# Patient Record
Sex: Male | Born: 1940 | Hispanic: No | Marital: Married | Smoking: Never smoker
Health system: Southern US, Community
[De-identification: ages and names within clinical notes are randomized; demographics above are authoritative.]

## PROBLEM LIST (undated history)

## (undated) ENCOUNTER — Emergency Department: Discharge status: Absent without leave | Payer: Medicare Other

## (undated) DIAGNOSIS — G309 Alzheimer's disease, unspecified: Secondary | ICD-10-CM

## (undated) DIAGNOSIS — F0280 Dementia in other diseases classified elsewhere without behavioral disturbance: Secondary | ICD-10-CM

## (undated) DIAGNOSIS — G20 Parkinson's disease: Secondary | ICD-10-CM

## (undated) HISTORY — PX: HIP SURGERY: SHX245

---

## 2015-10-02 ENCOUNTER — Inpatient Hospital Stay
Admission: EM | Admit: 2015-10-02 | Discharge: 2015-10-07 | DRG: 470 | Disposition: A | Discharge status: Skilled Nursing Facility | Payer: Medicare Other | Attending: Internal Medicine | Admitting: Internal Medicine

## 2015-10-02 ENCOUNTER — Encounter: Payer: Self-pay | Admitting: Emergency Medicine

## 2015-10-02 ENCOUNTER — Emergency Department: Payer: Medicare Other

## 2015-10-02 DIAGNOSIS — S72009A Fracture of unspecified part of neck of unspecified femur, initial encounter for closed fracture: Secondary | ICD-10-CM | POA: Diagnosis present

## 2015-10-02 DIAGNOSIS — Z79899 Other long term (current) drug therapy: Secondary | ICD-10-CM | POA: Diagnosis not present

## 2015-10-02 DIAGNOSIS — F0280 Dementia in other diseases classified elsewhere without behavioral disturbance: Secondary | ICD-10-CM | POA: Diagnosis present

## 2015-10-02 DIAGNOSIS — Z96649 Presence of unspecified artificial hip joint: Secondary | ICD-10-CM

## 2015-10-02 DIAGNOSIS — S72011A Unspecified intracapsular fracture of right femur, initial encounter for closed fracture: Secondary | ICD-10-CM | POA: Diagnosis present

## 2015-10-02 DIAGNOSIS — E871 Hypo-osmolality and hyponatremia: Secondary | ICD-10-CM | POA: Diagnosis present

## 2015-10-02 DIAGNOSIS — D62 Acute posthemorrhagic anemia: Secondary | ICD-10-CM | POA: Diagnosis not present

## 2015-10-02 DIAGNOSIS — G20 Parkinson's disease: Secondary | ICD-10-CM | POA: Diagnosis present

## 2015-10-02 DIAGNOSIS — G309 Alzheimer's disease, unspecified: Secondary | ICD-10-CM | POA: Diagnosis present

## 2015-10-02 DIAGNOSIS — W010XXA Fall on same level from slipping, tripping and stumbling without subsequent striking against object, initial encounter: Secondary | ICD-10-CM | POA: Diagnosis present

## 2015-10-02 DIAGNOSIS — M6281 Muscle weakness (generalized): Secondary | ICD-10-CM

## 2015-10-02 DIAGNOSIS — Z23 Encounter for immunization: Secondary | ICD-10-CM | POA: Diagnosis not present

## 2015-10-02 DIAGNOSIS — M25551 Pain in right hip: Secondary | ICD-10-CM | POA: Diagnosis present

## 2015-10-02 DIAGNOSIS — S72001A Fracture of unspecified part of neck of right femur, initial encounter for closed fracture: Secondary | ICD-10-CM

## 2015-10-02 DIAGNOSIS — R262 Difficulty in walking, not elsewhere classified: Secondary | ICD-10-CM

## 2015-10-02 HISTORY — DX: Parkinson's disease: G20

## 2015-10-02 HISTORY — DX: Alzheimer's disease, unspecified: G30.9

## 2015-10-02 HISTORY — DX: Dementia in other diseases classified elsewhere, unspecified severity, without behavioral disturbance, psychotic disturbance, mood disturbance, and anxiety: F02.80

## 2015-10-02 LAB — CBC WITH DIFFERENTIAL/PLATELET
BASOS PCT: 1 %
Basophils Absolute: 0.1 10*3/uL (ref 0–0.1)
Eosinophils Absolute: 0.1 10*3/uL (ref 0–0.7)
Eosinophils Relative: 1 %
HEMATOCRIT: 34.6 % — AB (ref 40.0–52.0)
HEMOGLOBIN: 12 g/dL — AB (ref 13.0–18.0)
LYMPHS ABS: 0.7 10*3/uL — AB (ref 1.0–3.6)
LYMPHS PCT: 7 %
MCH: 30.4 pg (ref 26.0–34.0)
MCHC: 34.8 g/dL (ref 32.0–36.0)
MCV: 87.3 fL (ref 80.0–100.0)
MONO ABS: 1.2 10*3/uL — AB (ref 0.2–1.0)
MONOS PCT: 12 %
NEUTROS ABS: 7.6 10*3/uL — AB (ref 1.4–6.5)
NEUTROS PCT: 79 %
Platelets: 210 10*3/uL (ref 150–440)
RBC: 3.96 MIL/uL — ABNORMAL LOW (ref 4.40–5.90)
RDW: 15 % — ABNORMAL HIGH (ref 11.5–14.5)
WBC: 9.5 10*3/uL (ref 3.8–10.6)

## 2015-10-02 LAB — BASIC METABOLIC PANEL
ANION GAP: 5 (ref 5–15)
BUN: 47 mg/dL — ABNORMAL HIGH (ref 6–20)
CHLORIDE: 107 mmol/L (ref 101–111)
CO2: 27 mmol/L (ref 22–32)
Calcium: 9.1 mg/dL (ref 8.9–10.3)
Creatinine, Ser: 0.97 mg/dL (ref 0.61–1.24)
GFR calc non Af Amer: >60 mL/min (ref >60)
GLUCOSE: 145 mg/dL — AB (ref 65–99)
POTASSIUM: 3.7 mmol/L (ref 3.5–5.1)
Sodium: 139 mmol/L (ref 135–145)

## 2015-10-02 LAB — URINALYSIS COMPLETE WITH MICROSCOPIC (ARMC ONLY)
BACTERIA UA: NONE SEEN
Bilirubin Urine: NEGATIVE
Glucose, UA: 50 mg/dL — AB
LEUKOCYTES UA: NEGATIVE
Nitrite: NEGATIVE
PH: 5 (ref 5.0–8.0)
PROTEIN: 30 mg/dL — AB
Specific Gravity, Urine: 1.036 — ABNORMAL HIGH (ref 1.005–1.030)

## 2015-10-02 LAB — MAGNESIUM: Magnesium: 2.3 mg/dL (ref 1.7–2.4)

## 2015-10-02 LAB — PHOSPHORUS: PHOSPHORUS: 2.8 mg/dL (ref 2.5–4.6)

## 2015-10-02 MED ORDER — ONDANSETRON HCL 4 MG PO TABS: 4.0000 mg | ORAL_TABLET | Freq: Four times a day (QID) | ORAL | Status: DC | PRN

## 2015-10-02 MED ORDER — MORPHINE SULFATE (PF) 2 MG/ML IV SOLN: 1.0000 mg | INTRAVENOUS | Status: DC | PRN

## 2015-10-02 MED ORDER — SENNOSIDES-DOCUSATE SODIUM 8.6-50 MG PO TABS: 1.0000 | ORAL_TABLET | Freq: Every evening | ORAL | Status: DC | PRN

## 2015-10-02 MED ORDER — ZOLPIDEM TARTRATE 5 MG PO TABS
5.0000 mg | ORAL_TABLET | Freq: Every evening | ORAL | Status: DC | PRN
  Administered 2015-10-02: 5 mg via ORAL
  Filled 2015-10-02: qty 1

## 2015-10-02 MED ORDER — SODIUM CHLORIDE 0.9 % IV SOLN
INTRAVENOUS | Status: DC
  Administered 2015-10-02 – 2015-10-03 (×2): via INTRAVENOUS

## 2015-10-02 MED ORDER — ONDANSETRON HCL 4 MG/2ML IJ SOLN: 4.0000 mg | Freq: Four times a day (QID) | INTRAMUSCULAR | Status: DC | PRN

## 2015-10-02 MED ORDER — HYDROCODONE-ACETAMINOPHEN 5-325 MG PO TABS
1.0000 | ORAL_TABLET | ORAL | Status: DC | PRN
  Administered 2015-10-03: 1 via ORAL
  Filled 2015-10-02: qty 1

## 2015-10-02 MED ORDER — DONEPEZIL HCL 5 MG PO TABS
10.0000 mg | ORAL_TABLET | Freq: Every day | ORAL | Status: DC
  Administered 2015-10-04 – 2015-10-06 (×3): 10 mg via ORAL
  Filled 2015-10-02 (×2): qty 2

## 2015-10-02 MED ORDER — ENOXAPARIN SODIUM 40 MG/0.4ML ~~LOC~~ SOLN
40.0000 mg | SUBCUTANEOUS | Status: DC
Start: 2015-10-02 — End: 2015-10-04

## 2015-10-02 MED ORDER — CEFAZOLIN SODIUM-DEXTROSE 2-4 GM/100ML-% IV SOLN: 2.0000 g | INTRAVENOUS | Status: DC

## 2015-10-02 MED ORDER — CARBIDOPA-LEVODOPA 25-250 MG PO TABS
1.0000 | ORAL_TABLET | Freq: Three times a day (TID) | ORAL | Status: DC
  Administered 2015-10-02 – 2015-10-03 (×3): 1 via ORAL
  Filled 2015-10-02 (×3): qty 1

## 2015-10-02 MED ORDER — MAGNESIUM CITRATE PO SOLN
1.0000 | Freq: Once | ORAL | Status: DC | PRN
Start: 2015-10-02 — End: 2015-10-04

## 2015-10-02 MED ORDER — ACETAMINOPHEN 650 MG RE SUPP: 650.0000 mg | Freq: Four times a day (QID) | RECTAL | Status: DC | PRN

## 2015-10-02 MED ORDER — ACETAMINOPHEN 325 MG PO TABS: 650.0000 mg | ORAL_TABLET | Freq: Four times a day (QID) | ORAL | Status: DC | PRN

## 2015-10-02 MED ORDER — BISACODYL 5 MG PO TBEC: 5.0000 mg | DELAYED_RELEASE_TABLET | Freq: Every day | ORAL | Status: DC | PRN

## 2015-10-02 NOTE — ED Notes (Signed)
See triage note  Per family he took a fall 2 days ago ..haivng increased pain to right hip when standing

## 2015-10-02 NOTE — ED Provider Notes (Signed)
[facility] Emergency Department Provider Note  ____________________________________________  Time seen: Approximately 7:18 PM  I have reviewed the triage vital signs and the nursing notes.   HISTORY  Chief Complaint Fall    HPI Derek Hendricks is a 75 y.o. male with a history of Alzheimer's and Parkinson's disease, who presents emergency department with family members for complaint of right hip pain.Per the Family, patient tripped, falling hitting his right hip on a tabletop fan 2 days prior. Patient was complaining of right upper leg pain status post injury. Patient has had trouble ambulating since the event. Patient continues to complain of pain. With symptoms not improving, family presents to the emergency Department with patient for evaluation.  Per the family, the patient did not hit his head or lose consciousness at any time. Patient has not complained of any other complaints other than right hip pain. No numbness or tingling. No back pain. No chest pain, shortness of breath, abdominal pain, nausea or vomiting. Patient did have a history of injury to left hip approximately 8-10 years ago. 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   Past Medical History:  Diagnosis Date  . Alzheimer disease   . Parkinson disease (HCC)     There are no active problems to display for this patient.   Past Surgical History:  Procedure Laterality Date  . HIP SURGERY      Prior to Admission medications   Medication Sig Start Date End Date Taking? Authorizing Provider  carbidopa-levodopa (SINEMET IR) 25-250 MG tablet Take 1 tablet by mouth 3 (three) times daily.   Yes Historical Provider, MD  donepezil (ARICEPT) 10 MG tablet Take 10 mg by mouth at bedtime.   Yes Historical Provider, MD    Allergies Review of patient's allergies indicates no known allergies.  History reviewed. No pertinent family history.  Social History Social History   Substance Use Topics  . Smoking status: Never Smoker  . Smokeless tobacco: Never Used  . Alcohol use No     Review of Systems  Constitutional: No fever/chills Eyes: No visual changes.  Cardiovascular: no chest pain. Respiratory: no cough. No SOB. Gastrointestinal: No abdominal pain.  No nausea, no vomiting.   Musculoskeletal: Positive for right hip pain Skin: Negative for rash, abrasions, lacerations, ecchymosis. Neurological: Negative for headaches, focal weakness or numbness. 10-point ROS otherwise negative.  ____________________________________________   PHYSICAL EXAM:  VITAL SIGNS: ED Triage Vitals  Enc Vitals Group     BP 10/02/15 1853 (!) 134/54     Pulse Rate 10/02/15 1850 62     Resp 10/02/15 1850 16     Temp 10/02/15 1850 97.5 F (36.4 C)     Temp Source 10/02/15 1850 Oral     SpO2 10/02/15 1850 100 %     Weight 10/02/15 1852 130 lb (59 kg)     Height 10/02/15 1852 5\' 6"  (1.676 m)     Head Circumference --      Peak Flow --      Pain Score --      Pain Loc --      Pain Edu? --      Excl. in GC? --      Constitutional: Alert and oriented. Well appearing and in no acute distress. Eyes: Conjunctivae are normal. PERRL. EOMI. Head: Atraumatic.  Neck: No stridor.  No cervical spine tenderness to palpation  Cardiovascular: Normal rate, regular rhythm. Normal S1 and S2.  Good peripheral circulation. Respiratory:  Normal respiratory effort without tachypnea or retractions. Lungs CTAB. Good air entry to the bases with no decreased or absent breath sounds. Gastrointestinal: Bowel sounds 4 quadrants. Soft and nontender to palpation. No guarding or rigidity. No palpable masses. No distention.  Musculoskeletal: No gross deformity noted to pelvis or right hip upon inspection. Limited range of motion to right hip and right lower extremity. Patient is extremely tender to palpation both to the anterior and lateral aspects of the right hip. No palpable abnormality.  Examination of the knee and lumbar spine are unremarkable. Sensation intact and equal to the unaffected extremity. Dorsalis pedis pulse intact. Due to mechanism of injury with patient's age, passive range of motion is not undertaken until imaging is procured. Update: Imaging returns with femoral neck fracture. No range of motion is performed to femur/hip Neurologic:  Normal speech and language. No gross focal neurologic deficits are appreciated.  Skin:  Skin is warm, dry and intact. No rash noted. Psychiatric: Mood and affect are normal. Speech and behavior are normal. Patient exhibits appropriate insight and judgement.   ____________________________________________   LABS (all labs ordered are listed, but only abnormal results are displayed)  Labs Reviewed  BASIC METABOLIC PANEL - Abnormal; Notable for the following:       Result Value   Glucose, Bld 145 (*)    BUN 47 (*)    All other components within normal limits  CBC WITH DIFFERENTIAL/PLATELET - Abnormal; Notable for the following:    RBC 3.96 (*)    Hemoglobin 12.0 (*)    HCT 34.6 (*)    RDW 15.0 (*)    Neutro Abs 7.6 (*)    Lymphs Abs 0.7 (*)    Monocytes Absolute 1.2 (*)    All other components within normal limits   ____________________________________________  EKG   ____________________________________________  RADIOLOGY Festus Barren Cuthriell, personally viewed and evaluated these images (plain radiographs) as part of my medical decision making, as well as reviewing the written report by the radiologist.  Dg Chest 1 View  Result Date: 10/02/2015 CLINICAL DATA:  75 year old who fell 2 days ago and injured the right lower extremity. Painful ambulation since that time. Initial encounter. Preoperative evaluation. EXAM: CHEST 1 VIEW COMPARISON:  None. FINDINGS: Cardiac silhouette mildly enlarged for AP supine technique. Thoracic aorta atherosclerotic. Prominent bronchovascular markings diffusely and moderate central  peribronchial thickening. Lungs otherwise clear. No localized airspace consolidation. No pleural effusions. No pneumothorax. Normal pulmonary vascularity. IMPRESSION: Mild cardiomegaly. Moderate changes of bronchitis and/or asthma which may be acute or chronic. No acute cardiopulmonary disease otherwise. Electronically Signed   By: Hulan Saas M.D.   On: 10/02/2015 19:49   Dg Hip Unilat W Or Wo Pelvis 2-3 Views Right  Result Date: 10/02/2015 CLINICAL DATA:  75 year old who fell 2 days ago and injured the right lower extremity. Painful ambulation since that time. Initial encounter. EXAM: DG HIP (WITH OR WITHOUT PELVIS) 2-3V RIGHT COMPARISON:  None. FINDINGS: Acute subcapital right femoral neck fracture. Right hip joint anatomically aligned with mild axial joint space narrowing. No other fractures. Included AP pelvis demonstrates a left total hip arthroplasty with anatomic alignment. Sacroiliac joints intact. Symphysis pubis intact. Visualized lower lumbar spine intact. Osseous demineralization. IMPRESSION: Acute comminuted subcapital right femoral neck fracture. Electronically Signed   By: Hulan Saas M.D.   On: 10/02/2015 19:47   Dg Femur Min 2 Views Right  Result Date: 10/02/2015 CLINICAL DATA:  75 year old who fell 2 days ago and injured the right lower  extremity. Painful ambulation since that time. Initial encounter. EXAM: RIGHT FEMUR 2 VIEWS COMPARISON:  Right hip x-rays obtained concurrently. FINDINGS: Subcapital right femoral neck fracture as detailed on the report of the right hip images. No fractures elsewhere involving the femur. Moderate medial compartment joint space narrowing at the knee. IMPRESSION: Subcapital right femoral neck fracture as detailed on the report of the right hip images. No other fractures involving the femur. Electronically Signed   By: Hulan Saashomas  Lawrence M.D.   On: 10/02/2015 19:48    ____________________________________________    PROCEDURES  Procedure(s)  performed:    Procedures    Medications - No data to display   ____________________________________________   INITIAL IMPRESSION / ASSESSMENT AND PLAN / ED COURSE  Pertinent labs & imaging results that were available during my care of the patient were reviewed by me and considered in my medical decision making (see chart for details).  Review of the  [place]RS was performed in accordance of the [facility] prior to dispensing any controlled drugs.  Clinical Course    Patient's diagnosis is consistent with Fall resulting in right femoral neck fracture. X-rays revealed the above diagnosis. Patient suffered a mechanical fall, striking her right hip against an inanimate object. Patient was having pain for the last 2 days with severe limitation in ambulation. X-rays were obtained which revealed the above diagnosis. Patient did not hit his head or lose consciousness. No other concerning physical exam findings. Patient does have a history of Parkinson's and Alzheimer's dementia. Patient was a poor historian and majority of the information was provided by patient's family. Patient's family translated a few questions that patient did answer directly to the provider.   Orthopedic surgeon was consulted upon return of x-rays and he advised that patient should be admitted for surgery tomorrow. Hospitalist was consulted and patient will be admitted pending surgical intervention. Further evaluation    ____________________________________________  FINAL CLINICAL IMPRESSION(S) / ED DIAGNOSES  Final diagnoses:  Closed fracture of neck of right femur, initial encounter (HCC)      NEW MEDICATIONS STARTED DURING THIS VISIT:  New Prescriptions   No medications on file        This chart was dictated using voice recognition software/Dragon. Despite best efforts to proofread, errors can occur which can change the meaning. Any change was purely unintentional.    Racheal PatchesJonathan D Cuthriell, PA-C 10/02/15  2112    Willy EddyPatrick Robinson, MD 10/03/15 [phone]

## 2015-10-02 NOTE — ED Notes (Signed)
Pt taken to xray via stretcher  

## 2015-10-02 NOTE — ED Notes (Signed)
Pt returned from xray via stretcher.

## 2015-10-02 NOTE — ED Triage Notes (Signed)
Pt had mechanical fall 2 days ago. Denies hitting head. Hit right leg when fell and pain to right upper leg. Has had trouble walking since fall because of pain to leg. No swelling or deformity noted. No other injuries.

## 2015-10-03 ENCOUNTER — Inpatient Hospital Stay: Payer: Medicare Other | Admitting: Anesthesiology

## 2015-10-03 ENCOUNTER — Encounter
Admission: EM | Disposition: A | Discharge status: Skilled Nursing Facility | Payer: Self-pay | Source: Home / Self Care | Attending: Internal Medicine

## 2015-10-03 HISTORY — PX: HIP ARTHROPLASTY: SHX981

## 2015-10-03 LAB — BASIC METABOLIC PANEL
ANION GAP: 4 — AB (ref 5–15)
BUN: 38 mg/dL — AB (ref 6–20)
CALCIUM: 8.5 mg/dL — AB (ref 8.9–10.3)
CO2: 25 mmol/L (ref 22–32)
CREATININE: 0.81 mg/dL (ref 0.61–1.24)
Chloride: 110 mmol/L (ref 101–111)
GFR calc Af Amer: >60 mL/min (ref >60)
GLUCOSE: 91 mg/dL (ref 65–99)
Potassium: 3.5 mmol/L (ref 3.5–5.1)
Sodium: 139 mmol/L (ref 135–145)

## 2015-10-03 LAB — CBC
HCT: 30.8 % — ABNORMAL LOW (ref 40.0–52.0)
Hemoglobin: 10.8 g/dL — ABNORMAL LOW (ref 13.0–18.0)
MCH: 30.3 pg (ref 26.0–34.0)
MCHC: 35 g/dL (ref 32.0–36.0)
MCV: 86.7 fL (ref 80.0–100.0)
PLATELETS: 210 10*3/uL (ref 150–440)
RBC: 3.55 MIL/uL — ABNORMAL LOW (ref 4.40–5.90)
RDW: 15 % — AB (ref 11.5–14.5)
WBC: 6.6 10*3/uL (ref 3.8–10.6)

## 2015-10-03 LAB — PROTIME-INR
INR: 1.04
PROTHROMBIN TIME: 13.6 s (ref 11.4–15.2)

## 2015-10-03 LAB — TYPE AND SCREEN
ABO/RH(D): O POS
Antibody Screen: NEGATIVE

## 2015-10-03 LAB — SURGICAL PCR SCREEN
MRSA, PCR: NEGATIVE
Staphylococcus aureus: NEGATIVE

## 2015-10-03 SURGERY — HEMIARTHROPLASTY, HIP, DIRECT ANTERIOR APPROACH, FOR FRACTURE
Anesthesia: Spinal | Laterality: Right | Wound class: Clean

## 2015-10-03 MED ORDER — PROPOFOL 500 MG/50ML IV EMUL
INTRAVENOUS | Status: DC | PRN
  Administered 2015-10-03: 50 ug/kg/min via INTRAVENOUS

## 2015-10-03 MED ORDER — FENTANYL CITRATE (PF) 100 MCG/2ML IJ SOLN
INTRAMUSCULAR | Status: DC | PRN
  Administered 2015-10-03 (×4): 25 ug via INTRAVENOUS

## 2015-10-03 MED ORDER — BUPIVACAINE HCL (PF) 0.5 % IJ SOLN
INTRAMUSCULAR | Status: DC | PRN
  Administered 2015-10-03: 1.5 mL

## 2015-10-03 MED ORDER — SUCCINYLCHOLINE CHLORIDE 20 MG/ML IJ SOLN
INTRAMUSCULAR | Status: DC | PRN
  Administered 2015-10-03: 80 mg via INTRAVENOUS

## 2015-10-03 MED ORDER — LACTATED RINGERS IV SOLN
INTRAVENOUS | Status: DC | PRN
  Administered 2015-10-03 (×2): via INTRAVENOUS

## 2015-10-03 MED ORDER — INFLUENZA VAC SPLIT QUAD 0.5 ML IM SUSY
0.5000 mL | PREFILLED_SYRINGE | INTRAMUSCULAR | Status: AC
  Administered 2015-10-04: 0.5 mL via INTRAMUSCULAR
  Filled 2015-10-03: qty 0.5

## 2015-10-03 MED ORDER — EPHEDRINE SULFATE 50 MG/ML IJ SOLN
INTRAMUSCULAR | Status: DC | PRN
  Administered 2015-10-03 (×3): 5 mg via INTRAVENOUS

## 2015-10-03 MED ORDER — PROPOFOL 10 MG/ML IV BOLUS
INTRAVENOUS | Status: DC | PRN
  Administered 2015-10-03: 10 mg via INTRAVENOUS
  Administered 2015-10-03: 80 mg via INTRAVENOUS

## 2015-10-03 MED ORDER — CEFAZOLIN SODIUM-DEXTROSE 2-4 GM/100ML-% IV SOLN
2.0000 g | Freq: Once | INTRAVENOUS | Status: AC
  Administered 2015-10-03: 2 g via INTRAVENOUS
  Filled 2015-10-03: qty 100

## 2015-10-03 MED ORDER — ROCURONIUM BROMIDE 100 MG/10ML IV SOLN
INTRAVENOUS | Status: DC | PRN
  Administered 2015-10-03 (×3): 10 mg via INTRAVENOUS
  Administered 2015-10-03: 30 mg via INTRAVENOUS
  Administered 2015-10-04: 10 mg via INTRAVENOUS

## 2015-10-03 MED ORDER — PHENYLEPHRINE HCL 10 MG/ML IJ SOLN
INTRAMUSCULAR | Status: DC | PRN
  Administered 2015-10-03: 50 ug via INTRAVENOUS
  Administered 2015-10-03: 100 ug via INTRAVENOUS
  Administered 2015-10-03 (×5): 50 ug via INTRAVENOUS
  Administered 2015-10-03 – 2015-10-04 (×7): 100 ug via INTRAVENOUS
  Administered 2015-10-04: 150 ug via INTRAVENOUS
  Administered 2015-10-04 (×2): 100 ug via INTRAVENOUS

## 2015-10-03 MED ORDER — ONDANSETRON HCL 4 MG/2ML IJ SOLN
INTRAMUSCULAR | Status: DC | PRN
  Administered 2015-10-03: 4 mg via INTRAVENOUS

## 2015-10-03 MED ORDER — TETRACAINE HCL 1 % IJ SOLN
INTRAMUSCULAR | Status: DC | PRN
  Administered 2015-10-03: 10 mg via INTRASPINAL

## 2015-10-03 SURGICAL SUPPLY — 61 items
BAG DECANTER FOR FLEXI CONT (MISCELLANEOUS) ×3 IMPLANT
BLADE SAW 1 (BLADE) ×3 IMPLANT
CANISTER SUCT 1200ML W/VALVE (MISCELLANEOUS) ×3 IMPLANT
CANISTER SUCT 3000ML (MISCELLANEOUS) ×6 IMPLANT
CAPT HIP HEMI 1 ×3 IMPLANT
CATH FOL LEG HOLDER (MISCELLANEOUS) IMPLANT
CEMENT HV SMART SET (Cement) ×6 IMPLANT
DRAPE INCISE IOBAN 66X60 STRL (DRAPES) ×3 IMPLANT
DRAPE SHEET LG 3/4 BI-LAMINATE (DRAPES) ×3 IMPLANT
DRAPE TABLE BACK 80X90 (DRAPES) ×3 IMPLANT
DRSG DERMACEA 8X12 NADH (GAUZE/BANDAGES/DRESSINGS) ×3 IMPLANT
DRSG OPSITE POSTOP 4X12 (GAUZE/BANDAGES/DRESSINGS) IMPLANT
DRSG OPSITE POSTOP 4X14 (GAUZE/BANDAGES/DRESSINGS) IMPLANT
DRSG TEGADERM 4X4.75 (GAUZE/BANDAGES/DRESSINGS) IMPLANT
DURAPREP 26ML APPLICATOR (WOUND CARE) ×6 IMPLANT
ELECT BLADE 6.5 EXT (BLADE) ×3 IMPLANT
ELECT CAUTERY BLADE 6.4 (BLADE) ×3 IMPLANT
ELECT REM PT RETURN 9FT ADLT (ELECTROSURGICAL) ×3
ELECTRODE REM PT RTRN 9FT ADLT (ELECTROSURGICAL) ×1 IMPLANT
GAUZE PACK 2X3YD (MISCELLANEOUS) ×3 IMPLANT
GLOVE BIO SURGEON STRL SZ8 (GLOVE) IMPLANT
GLOVE BIOGEL M STRL SZ7.5 (GLOVE) ×6 IMPLANT
GLOVE BIOGEL PI IND STRL 9 (GLOVE) IMPLANT
GLOVE BIOGEL PI INDICATOR 9 (GLOVE)
GLOVE INDICATOR 8.0 STRL GRN (GLOVE) ×6 IMPLANT
GOWN STRL REUS W/ TWL LRG LVL3 (GOWN DISPOSABLE) ×2 IMPLANT
GOWN STRL REUS W/TWL 2XL LVL3 (GOWN DISPOSABLE) IMPLANT
GOWN STRL REUS W/TWL LRG LVL3 (GOWN DISPOSABLE) ×4
HANDLE SUCTION POOLE (INSTRUMENTS) ×1 IMPLANT
HANDPIECE INTERPULSE COAX TIP (DISPOSABLE) ×2
HEMOVAC 400CC 10FR (MISCELLANEOUS) ×3 IMPLANT
HOOD PEEL AWAY FLYTE STAYCOOL (MISCELLANEOUS) ×6 IMPLANT
IV NS 100ML SINGLE PACK (IV SOLUTION) ×3 IMPLANT
KIT RM TURNOVER STRD PROC AR (KITS) ×3 IMPLANT
NDL SAFETY 18GX1.5 (NEEDLE) ×3 IMPLANT
NEEDLE FILTER BLUNT 18X 1/2SAF (NEEDLE) ×2
NEEDLE FILTER BLUNT 18X1 1/2 (NEEDLE) ×1 IMPLANT
NS IRRIG 1000ML POUR BTL (IV SOLUTION) ×3 IMPLANT
PACK HIP PROSTHESIS (MISCELLANEOUS) ×3 IMPLANT
PILLOW ABDUC SM (MISCELLANEOUS) ×3 IMPLANT
PRESSURIZER CEMENT PROX FEM SM (MISCELLANEOUS) ×3 IMPLANT
PRESSURIZER FEM CANAL M (MISCELLANEOUS) ×3 IMPLANT
SET HNDPC FAN SPRY TIP SCT (DISPOSABLE) ×1 IMPLANT
SOL .9 NS 3000ML IRR  AL (IV SOLUTION) ×2
SOL .9 NS 3000ML IRR UROMATIC (IV SOLUTION) ×1 IMPLANT
SOL PREP PVP 2OZ (MISCELLANEOUS) ×3
SOLUTION PREP PVP 2OZ (MISCELLANEOUS) ×1 IMPLANT
SPONGE DRAIN TRACH 4X4 STRL 2S (GAUZE/BANDAGES/DRESSINGS) IMPLANT
STAPLER SKIN PROX 35W (STAPLE) ×6 IMPLANT
SUCTION POOLE HANDLE (INSTRUMENTS) ×3
SUT ETHIBOND #5 BRAIDED 30INL (SUTURE) ×3 IMPLANT
SUT VIC AB 0 CT1 36 (SUTURE) ×3 IMPLANT
SUT VIC AB 1 CT1 36 (SUTURE) ×6 IMPLANT
SUT VIC AB 2-0 CT1 27 (SUTURE) ×2
SUT VIC AB 2-0 CT1 TAPERPNT 27 (SUTURE) ×1 IMPLANT
SYR 20CC LL (SYRINGE) ×3 IMPLANT
SYR TB 1ML LUER SLIP (SYRINGE) ×3 IMPLANT
TAPE TRANSPORE STRL 2 31045 (GAUZE/BANDAGES/DRESSINGS) IMPLANT
TIP COAXIAL FEMORAL CANAL (MISCELLANEOUS) ×3 IMPLANT
TIP HIGH FLOW INTERPULSE (MISCELLANEOUS) ×3 IMPLANT
TOWER CARTRIDGE SMART MIX (DISPOSABLE) ×3 IMPLANT

## 2015-10-03 NOTE — Plan of Care (Addendum)
Patient prepped for OR. Report called. NPO since 8AM. No beta blockers. CHG x2 applied. Consents X2 signed. Family re-informed of surgery time. Patient voided.

## 2015-10-03 NOTE — Progress Notes (Signed)
[facility] [place] at [facility]                                                                                                                                                                                            Patient Demographics   Derek Hendricks, is a 75 y.o. male, DOB - [birth year], ZOX:[id number]  Admit date - 10/02/2015   Admitting Physician Tonye Royalty, DO  Outpatient Primary MD for the patient is No primary care provider on file.   LOS - 1  Subjective: Patient admitted with a hip fracture. I am able to communicate with him because I share same language. He denies any cardiac history, denies any chest pain or shortness of breath or any palpitations. Currently his pain is under control   Review of Systems:   CONSTITUTIONAL: No documented fever. No fatigue, weakness. No weight gain, no weight loss.  EYES: No blurry or double vision.  ENT: No tinnitus. No postnasal drip. No redness of the oropharynx.  RESPIRATORY: No cough, no wheeze, no hemoptysis. No dyspnea.  CARDIOVASCULAR: No chest pain. No orthopnea. No palpitations. No syncope.  GASTROINTESTINAL: No nausea, no vomiting or diarrhea. No abdominal pain. No melena or hematochezia.  GENITOURINARY: No dysuria or hematuria.  ENDOCRINE: No polyuria or nocturia. No heat or cold intolerance.  HEMATOLOGY: No anemia. No bruising. No bleeding.  INTEGUMENTARY: No rashes. No lesions.  MUSCULOSKELETAL: No arthritis. No swelling. No gout.  NEUROLOGIC: No numbness, tingling, or ataxia. No seizure-type activity.  PSYCHIATRIC: No anxiety. No insomnia. No ADD.    Vitals:   Vitals:   10/02/15 2229 10/02/15 2323 10/03/15 0454 10/03/15 0745  BP: (!) 157/75 (!) 162/68 124/61 136/60  Pulse: (!) 58 (!) 56 65 (!) 58  Resp: 18 12 18 18   Temp:  98.4 F (36.9 C) 97.6 F (36.4 C) 98.9 F (37.2 C)  TempSrc:  Oral Oral Oral  SpO2: 98% 100% 98% 97%  Weight:      Height:        Wt Readings from Last 3  Encounters:  10/02/15 130 lb (59 kg)     Intake/Output Summary (Last 24 hours) at 10/03/15 1028 Last data filed at 10/03/15 0500  Gross per 24 hour  Intake              450 ml  Output              400 ml  Net               50 ml    Physical Exam:   GENERAL: Pleasant-appearing in no apparent distress.  HEAD, EYES, EARS, NOSE AND THROAT: Atraumatic, normocephalic. Extraocular muscles are intact. Pupils equal and reactive to light. Sclerae anicteric. No conjunctival injection. No oro-pharyngeal erythema.  NECK: Supple. There is no jugular venous distention. No bruits, no lymphadenopathy, no thyromegaly.  HEART: Regular rate and rhythm,. No murmurs, no rubs, no clicks.  LUNGS: Clear to auscultation bilaterally. No rales or rhonchi. No wheezes.  ABDOMEN: Soft, flat, nontender, nondistended. Has good bowel sounds. No hepatosplenomegaly appreciated.  EXTREMITIES: No evidence of any cyanosis, clubbing, or peripheral edema.  +2 pedal and radial pulses bilaterally.  NEUROLOGIC: The patient is alert, awake, and oriented x3 with no focal motor or sensory deficits appreciated bilaterally.  SKIN: Moist and warm with no rashes appreciated.  Psych: Not anxious, depressed LN: No inguinal LN enlargement    Antibiotics   Anti-infectives    Start     Dose/Rate Route Frequency Ordered Stop   10/02/15 2200  ceFAZolin (ANCEF) IVPB 2g/100 mL premix    Comments:  SEND WITH THE PATIENT TO THE OR. DO NOT ADMINISTER ON THE UNIT!   2 g 200 mL/hr over 30 Minutes Intravenous To Surgery 10/02/15 2158 10/03/15 2200      Medications   Scheduled Meds: . carbidopa-levodopa  1 tablet Oral TID  .  ceFAZolin (ANCEF) IV  2 g Intravenous To OR  . donepezil  10 mg Oral QHS  . enoxaparin (LOVENOX) injection  40 mg Subcutaneous Q24H  . [START ON 10/04/2015] Influenza vac split quadrivalent PF  0.5 mL Intramuscular Tomorrow-1000   Continuous Infusions: . sodium chloride 75 mL/hr at 10/02/15 2336   PRN  Meds:.acetaminophen **OR** acetaminophen, bisacodyl, HYDROcodone-acetaminophen, magnesium citrate, morphine injection, ondansetron **OR** ondansetron (ZOFRAN) IV, senna-docusate, zolpidem   Data Review:   Micro Results No results found for this or any previous visit (from the past 240 hour(s)).  Radiology Reports Dg Chest 1 View  Result Date: 10/02/2015 CLINICAL DATA:  75 year old who fell 2 days ago and injured the right lower extremity. Painful ambulation since that time. Initial encounter. Preoperative evaluation. EXAM: CHEST 1 VIEW COMPARISON:  None. FINDINGS: Cardiac silhouette mildly enlarged for AP supine technique. Thoracic aorta atherosclerotic. Prominent bronchovascular markings diffusely and moderate central peribronchial thickening. Lungs otherwise clear. No localized airspace consolidation. No pleural effusions. No pneumothorax. Normal pulmonary vascularity. IMPRESSION: Mild cardiomegaly. Moderate changes of bronchitis and/or asthma which may be acute or chronic. No acute cardiopulmonary disease otherwise. Electronically Signed   By: Hulan Saashomas  Lawrence M.D.   On: 10/02/2015 19:49   Dg Hip Unilat W Or Wo Pelvis 2-3 Views Right  Result Date: 10/02/2015 CLINICAL DATA:  75 year old who fell 2 days ago and injured the right lower extremity. Painful ambulation since that time. Initial encounter. EXAM: DG HIP (WITH OR WITHOUT PELVIS) 2-3V RIGHT COMPARISON:  None. FINDINGS: Acute subcapital right femoral neck fracture. Right hip joint anatomically aligned with mild axial joint space narrowing. No other fractures. Included AP pelvis demonstrates a left total hip arthroplasty with anatomic alignment. Sacroiliac joints intact. Symphysis pubis intact. Visualized lower lumbar spine intact. Osseous demineralization. IMPRESSION: Acute comminuted subcapital right femoral neck fracture. Electronically Signed   By: Hulan Saashomas  Lawrence M.D.   On: 10/02/2015 19:47   Dg Femur Min 2 Views Right  Result Date:  10/02/2015 CLINICAL DATA:  75 year old who fell 2 days ago and injured the right lower extremity. Painful ambulation since that time. Initial encounter. EXAM: RIGHT FEMUR 2 VIEWS COMPARISON:  Right hip x-rays obtained concurrently. FINDINGS: Subcapital right femoral neck fracture as detailed  on the report of the right hip images. No fractures elsewhere involving the femur. Moderate medial compartment joint space narrowing at the knee. IMPRESSION: Subcapital right femoral neck fracture as detailed on the report of the right hip images. No other fractures involving the femur. Electronically Signed   By: Hulan Saas M.D.   On: 10/02/2015 19:48     CBC  Recent Labs Lab 10/02/15 2022 10/03/15 0515  WBC 9.5 6.6  HGB 12.0* 10.8*  HCT 34.6* 30.8*  PLT 210 210  MCV 87.3 86.7  MCH 30.4 30.3  MCHC 34.8 35.0  RDW 15.0* 15.0*  LYMPHSABS 0.7*  --   MONOABS 1.2*  --   EOSABS 0.1  --   BASOSABS 0.1  --     Chemistries   Recent Labs Lab 10/02/15 2022 10/03/15 0515  NA 139 139  K 3.7 3.5  CL 107 110  CO2 27 25  GLUCOSE 145* 91  BUN 47* 38*  CREATININE 0.97 0.81  CALCIUM 9.1 8.5*  MG 2.3  --    ------------------------------------------------------------------------------------------------------------------ estimated creatinine clearance is 64.7 mL/min (by C-G formula based on SCr of 0.81 mg/dL). ------------------------------------------------------------------------------------------------------------------ No results for input(s): HGBA1C in the last 72 hours. ------------------------------------------------------------------------------------------------------------------ No results for input(s): CHOL, HDL, LDLCALC, TRIG, CHOLHDL, LDLDIRECT in the last 72 hours. ------------------------------------------------------------------------------------------------------------------ No results for input(s): TSH, T4TOTAL, T3FREE, THYROIDAB in the last 72 hours.  Invalid input(s):  FREET3 ------------------------------------------------------------------------------------------------------------------ No results for input(s): VITAMINB12, FOLATE, FERRITIN, TIBC, IRON, RETICCTPCT in the last 72 hours.  Coagulation profile  Recent Labs Lab 10/03/15 0515  INR 1.04    No results for input(s): DDIMER in the last 72 hours.  Cardiac Enzymes No results for input(s): CKMB, TROPONINI, MYOGLOBIN in the last 168 hours.  Invalid input(s): CK ------------------------------------------------------------------------------------------------------------------ Invalid input(s): POCBNP    Assessment & Plan   This is a 75 y.o. male with a history of Alzheimer's dementia and Parkinson's now being admitted with: 1. Right femoral neck fracture- Patient currently has no cardiopulmonary symptoms, no cardiac history At this time he is at moderate risk of surgical complications, he does not need any cardiac clearance from cardiology. I will cancel the cardiology consult okay to proceed to surgery  2. Hyponatremia, mild-gentle IV normal saline 3. History of Alzheimer's dementia-continue Sinemet and Aricept.     Code Status Orders        Start     Ordered   10/02/15 2159  Full code  Continuous     10/02/15 2158    Code Status History    Date Active Date Inactive Code Status Order ID Comments User Context   This patient has a current code status but no historical code status.           Consults  Orthopedic   DVT Prophylaxis  SCDs  Lab Results  Component Value Date   PLT 210 10/03/2015     Time Spent in minutes   Greater than 50% of time spent in care coordination and counseling patient regarding the condition and plan of care.   Auburn Bilberry M.D on 10/03/2015 at 10:28 AM  Between 7am to 6pm - Pager - [phone]  After 6pm go to [email or web address] - password [email or web address] [facility]  [facility] [place] [facility]   Office  [phone]

## 2015-10-03 NOTE — Anesthesia Procedure Notes (Addendum)
Spinal  Patient location during procedure: OR Start time: 10/03/2015 9:41 PM End time: 10/03/2015 9:44 PM Staffing Anesthesiologist: Katy Fitch K Performed: anesthesiologist  Preanesthetic Checklist Completed: patient identified, site marked, surgical consent, pre-op evaluation, timeout performed, IV checked, risks and benefits discussed and monitors and equipment checked Spinal Block Patient position: sitting Prep: ChloraPrep Patient monitoring: heart rate, continuous pulse ox, blood pressure and cardiac monitor Approach: midline Location: L4-5 Injection technique: single-shot Needle Needle type: Whitacre and Introducer  Needle gauge: 24 G Needle length: 9 cm Assessment Sensory level: T12 Additional Notes Patient shaking very hard throughout procedure 2/2 parkinson's disease. Negative paresthesia. Negative blood return. Positive free-flowing CSF. Expiration date of kit checked and confirmed. Patient tolerated procedure well, without complications.

## 2015-10-03 NOTE — H&P (Signed)
[facility] - [place] @ [facility] Admission History and Physical [employer] Jun [place], D.O.  ---------------------------------------------------------------------------------------------------------------------   PATIENT NAME: Derek Hendricks Loeper MR#: [id number] DATE OF BIRTH: Oct 20, 1940 DATE OF ADMISSION: 10/02/2015 PRIMARY CARE PHYSICIAN: No primary care provider on file.  REQUESTING/REFERRING PHYSICIAN: ED   CHIEF COMPLAINT: Chief Complaint  Patient presents with  . Fall    HISTORY OF PRESENT ILLNESS: Derek Hendricks Kraner is a 75 y.o. male with a known history of Alzheimer's dementia and Parkinson's disease was in a usual state of health until 2 days ago when he sustained a mood mechanical fall. Per his family patient hit his right hip and had been complaining of pain and difficulty ambulating. Patient reportedly did not lose consciousness or experiencing any preceding symptoms such as dizziness, lightheadedness or syncopal episodes.  Otherwise there has been no change in status. Patient has been taking medication as prescribed and there has been no recent change in medication or diet.  There has been no recent illness, travel or sick contacts.    Patient denies fevers/chills, weakness, dizziness, chest pain, shortness of breath, N/V/C/D, abdominal pain, dysuria/frequency, changes in mental status.    PAST MEDICAL HISTORY: Past Medical History:  Diagnosis Date  . Alzheimer disease   . Parkinson disease (HCC)       PAST SURGICAL HISTORY: Past Surgical History:  Procedure Laterality Date  . HIP SURGERY        SOCIAL HISTORY: Social History  Substance Use Topics  . Smoking status: Never Smoker  . Smokeless tobacco: Never Used  . Alcohol use No      FAMILY HISTORY: History reviewed. No pertinent family history.   MEDICATIONS AT HOME: Prior to Admission medications   Medication Sig Start Date End Date Taking? Authorizing Provider  carbidopa-levodopa (SINEMET IR) 25-250 MG  tablet Take 1 tablet by mouth 3 (three) times daily.   Yes Historical Provider, MD  donepezil (ARICEPT) 10 MG tablet Take 10 mg by mouth at bedtime.   Yes Historical Provider, MD      DRUG ALLERGIES: No Known Allergies   REVIEW OF SYSTEMS: CONSTITUTIONAL: No fever/chills, fatigue, weakness, weight gain/loss, headache EYES: No blurry or double vision. ENT: No tinnitus, postnasal drip, redness or soreness of the oropharynx. RESPIRATORY: No cough, wheeze, hemoptysis, dyspnea. CARDIOVASCULAR: No chest pain, orthopnea, palpitations, syncope. GASTROINTESTINAL: No nausea, vomiting, constipation, diarrhea, abdominal pain, hematemesis, melena or hematochezia. GENITOURINARY: No dysuria or hematuria. ENDOCRINE: No polyuria or nocturia. No heat or cold intolerance. HEMATOLOGY: No anemia, bruising, bleeding. INTEGUMENTARY: No rashes, ulcers, lesions. MUSCULOSKELETAL: No arthritis, swelling, gout. Positive right hip pain NEUROLOGIC: No numbness, tingling, weakness or ataxia. No seizure-type activity. PSYCHIATRIC: No anxiety, depression, insomnia.  PHYSICAL EXAMINATION: VITAL SIGNS: Blood pressure (!) 162/68, pulse (!) 56, temperature 98.4 F (36.9 C), temperature source Oral, resp. rate 12, height 5\' 6"  (1.676 m), weight 59 kg (130 lb), SpO2 100 %.  GENERAL: 75 y.o.-year-old male patient, well-developed, well-nourished lying in the bed in no acute distress.  Pleasant and cooperative.   HEENT: Head atraumatic, normocephalic. Pupils equal, round, reactive to light and accommodation. No scleral icterus. Extraocular muscles intact. Nares are patent. Oropharynx is clear. Mucus membranes moist. NECK: Supple, full range of motion. No JVD, no bruit heard. No thyroid enlargement, no tenderness, no cervical lymphadenopathy. CHEST: Normal breath sounds bilaterally. No wheezing, rales, rhonchi or crackles. No use of accessory muscles of respiration.  No reproducible chest wall tenderness.  CARDIOVASCULAR: S1,  S2 normal. No murmurs, rubs, or gallops. Cap refill <2 seconds.  ABDOMEN: Soft, nontender, nondistended. No rebound, guarding, rigidity. Normoactive bowel sounds present in all four quadrants. No organomegaly or mass. EXTREMITIES: Tenderness to the right hip with limitation of range of motion in all planes secondary to pain. No pedal edema, cyanosis, or clubbing. NEUROLOGIC: Cranial nerves II through XII are grossly intact with no focal sensorimotor deficit. Muscle strength 5/5 in all extremities. Sensation intact. Gait not checked. PSYCHIATRIC: The patient is alert and oriented x 3. Normal affect, mood, thought content. SKIN: Warm, dry, and intact without obvious rash, lesion, or ulcer.  LABORATORY PANEL:  CBC  Recent Labs Lab 10/02/15 2022  WBC 9.5  HGB 12.0*  HCT 34.6*  PLT 210   ----------------------------------------------------------------------------------------------------------------- Chemistries  Recent Labs Lab 10/02/15 2022  NA 139  K 3.7  CL 107  CO2 27  GLUCOSE 145*  BUN 47*  CREATININE 0.97  CALCIUM 9.1  MG 2.3   ------------------------------------------------------------------------------------------------------------------ Cardiac Enzymes No results for input(s): TROPONINI in the last 168 hours. ------------------------------------------------------------------------------------------------------------------  RADIOLOGY: Dg Chest 1 View  Result Date: 10/02/2015 CLINICAL DATA:  75 year old who fell 2 days ago and injured the right lower extremity. Painful ambulation since that time. Initial encounter. Preoperative evaluation. EXAM: CHEST 1 VIEW COMPARISON:  None. FINDINGS: Cardiac silhouette mildly enlarged for AP supine technique. Thoracic aorta atherosclerotic. Prominent bronchovascular markings diffusely and moderate central peribronchial thickening. Lungs otherwise clear. No localized airspace consolidation. No pleural effusions. No pneumothorax. Normal  pulmonary vascularity. IMPRESSION: Mild cardiomegaly. Moderate changes of bronchitis and/or asthma which may be acute or chronic. No acute cardiopulmonary disease otherwise. Electronically Signed   By: Hulan Saas M.D.   On: 10/02/2015 19:49   Dg Hip Unilat W Or Wo Pelvis 2-3 Views Right  Result Date: 10/02/2015 CLINICAL DATA:  75 year old who fell 2 days ago and injured the right lower extremity. Painful ambulation since that time. Initial encounter. EXAM: DG HIP (WITH OR WITHOUT PELVIS) 2-3V RIGHT COMPARISON:  None. FINDINGS: Acute subcapital right femoral neck fracture. Right hip joint anatomically aligned with mild axial joint space narrowing. No other fractures. Included AP pelvis demonstrates a left total hip arthroplasty with anatomic alignment. Sacroiliac joints intact. Symphysis pubis intact. Visualized lower lumbar spine intact. Osseous demineralization. IMPRESSION: Acute comminuted subcapital right femoral neck fracture. Electronically Signed   By: Hulan Saas M.D.   On: 10/02/2015 19:47   Dg Femur Min 2 Views Right  Result Date: 10/02/2015 CLINICAL DATA:  75 year old who fell 2 days ago and injured the right lower extremity. Painful ambulation since that time. Initial encounter. EXAM: RIGHT FEMUR 2 VIEWS COMPARISON:  Right hip x-rays obtained concurrently. FINDINGS: Subcapital right femoral neck fracture as detailed on the report of the right hip images. No fractures elsewhere involving the femur. Moderate medial compartment joint space narrowing at the knee. IMPRESSION: Subcapital right femoral neck fracture as detailed on the report of the right hip images. No other fractures involving the femur. Electronically Signed   By: Hulan Saas M.D.   On: 10/02/2015 19:48    EKG: Pending  IMPRESSION AND PLAN:  This is a 75 y.o. male with a history of Alzheimer's dementia and Parkinson's now being admitted with: 1. Right femoral neck fracture-admit for surgical consultation, pain  control, bed rest, nothing by mouth after midnight. Given patient's age and unclear history of recent cardiac workup would request cardiology consultation for optimization preoperatively. Will order EKG, lipids. 2. Hyponatremia, mild-gentle IV normal saline 3. History of Alzheimer's dementia-continue Sinemet and Aricept.   Diet/Nutrition: Heart healthy Fluids:  IV normal saline DVT Px: Lovenox, SCDs and early ambulation Code Status: Full  All the records are reviewed and case discussed with ED provider. Management plans discussed with the patient and/or family who express understanding and agree with plan of care.   TOTAL TIME TAKING CARE OF THIS PATIENT: 60 minutes.   Daisy Lites D.O. on 10/03/2015 at 12:57 AM Between 7am to 6pm - Pager - [phone] After 6pm go to [email or web address] - [facility] [facility] Office [phone] CC: Primary care physician; No primary care provider on file.     Note: This dictation was prepared with Dragon dictation along with smaller phrase technology. Any transcriptional errors that result from this process are unintentional.

## 2015-10-03 NOTE — Consult Note (Signed)
ORTHOPAEDIC CONSULTATION  PATIENT NAME: Derek Hendricks DOB: 11/08/1940  MRN: [id number]  REQUESTING PHYSICIAN: Tonye RoyaltyAlexis Hugelmeyer, D.O.  Chief Complaint: Right hip pain  HPI: Derek Hendricks is a 75 y.o. male who complains of  right hip and groin pain. The patient apparently sustained a mechanical fall 3 days ago and landed on his right hip and side. He has had difficulty with ambulating due to the right hip pain. He denied any other injuries. He denied a loss of consciousness. Prior to the fall he was ambulating with a cane. He presented to the Emergency Department for evaluation since the right hip pain was becoming worse and he was having increasing difficulty with any type of ambulation.  The patient was here visiting with family. [place]  Past Medical History:  Diagnosis Date  . Alzheimer disease   . Parkinson disease [facility](HCC)    Past Surgical History:  Procedure Laterality Date  . HIP SURGERY     x2; now with left total hip arthroplasty with cerclage cables   Social History   Social History  . Marital status: Married    Spouse name: N/A  . Number of children: N/A  . Years of education: N/A   Social History Main Topics  . Smoking status: Never Smoker  . Smokeless tobacco: Never Used  . Alcohol use No  . Drug use: No  . Sexual activity: Not Asked   Other Topics Concern  . None   Social History Narrative  . None   History reviewed. No pertinent family history. No Known Allergies Prior to Admission medications   Medication Sig Start Date End Date Taking? Authorizing Provider  carbidopa-levodopa (SINEMET IR) 25-250 MG tablet Take 1 tablet by mouth 3 (three) times daily.   Yes Historical Provider, MD  donepezil (ARICEPT) 10 MG tablet Take 10 mg by mouth at bedtime.   Yes Historical Provider, MD   Dg Chest 1 View  Result Date: 10/02/2015 CLINICAL DATA:  75 year old who fell 2 days ago and injured the right lower extremity. Painful ambulation since that  time. Initial encounter. Preoperative evaluation. EXAM: CHEST 1 VIEW COMPARISON:  None. FINDINGS: Cardiac silhouette mildly enlarged for AP supine technique. Thoracic aorta atherosclerotic. Prominent bronchovascular markings diffusely and moderate central peribronchial thickening. Lungs otherwise clear. No localized airspace consolidation. No pleural effusions. No pneumothorax. Normal pulmonary vascularity. IMPRESSION: Mild cardiomegaly. Moderate changes of bronchitis and/or asthma which may be acute or chronic. No acute cardiopulmonary disease otherwise. Electronically Signed   By: Hulan Saashomas  Lawrence M.D.   On: 10/02/2015 19:49   Dg Hip Unilat W Or Wo Pelvis 2-3 Views Right  Result Date: 10/02/2015 CLINICAL DATA:  75 year old who fell 2 days ago and injured the right lower extremity. Painful ambulation since that time. Initial encounter. EXAM: DG HIP (WITH OR WITHOUT PELVIS) 2-3V RIGHT COMPARISON:  None. FINDINGS: Acute subcapital right femoral neck fracture. Right hip joint anatomically aligned with mild axial joint space narrowing. No other fractures. Included AP pelvis demonstrates a left total hip arthroplasty with anatomic alignment. Sacroiliac joints intact. Symphysis pubis intact. Visualized lower lumbar spine intact. Osseous demineralization. IMPRESSION: Acute comminuted subcapital right femoral neck fracture. Electronically Signed   By: Hulan Saashomas  Lawrence M.D.   On: 10/02/2015 19:47   Dg Femur Min 2 Views Right  Result Date: 10/02/2015 CLINICAL DATA:  75 year old who fell 2 days ago and injured the right lower extremity. Painful ambulation since that time. Initial encounter. EXAM: RIGHT FEMUR 2 VIEWS COMPARISON:  Right hip  x-rays obtained concurrently. FINDINGS: Subcapital right femoral neck fracture as detailed on the report of the right hip images. No fractures elsewhere involving the femur. Moderate medial compartment joint space narrowing at the knee. IMPRESSION: Subcapital right femoral neck  fracture as detailed on the report of the right hip images. No other fractures involving the femur. Electronically Signed   By: Hulan Saas M.D.   On: 10/02/2015 19:48    Positive ROS: All other systems have been reviewed and were otherwise negative with the exception of those mentioned in the HPI and as above. Specifically, he denies any chest pain, palpitations, shortness of breath, wheezing, or edema.  Physical Exam: General: Alert and alert in no acute distress. HEENT: Atraumatic and normocephalic. Sclera are clear. Extraocular motion is intact. Oropharynx is clear with moist mucosa. Neck: Supple, nontender, good range of motion. No JVD or carotid bruits. Lungs: Clear to auscultation bilaterally. Cardiovascular: Regular rate and rhythm with normal S1 and S2. No murmurs. No gallops or rubs. Pedal pulses are palpable bilaterally. Homans test is negative bilaterally. No significant pretibial or ankle edema. Abdomen: Soft, nontender, and nondistended. Bowel sounds are present. Skin: No lesions in the area of chief complaint Neurologic: Awake, alert. Sensory function is grossly intact. Motor strength is felt to be 5 over 5 bilaterally. No clonus. Significant tremor to both upper extremities. Good motor coordination. Lymphatic: No axillary or cervical lymphadenopathy  MUSCULOSKELETAL: Examination of the upper extremities demonstrates good range of motion, strength, and stability. Examination of the right lower extremity shows leg to be slightly shortened and rotated. Pain is elicited with attempts at range of motion of the right hip. No significant ecchymosis or swelling to the thigh. Examination the knee shows no tenderness to palpation, no swelling, no effusion.  Assessment: Displaced right femoral neck fracture  Plan: The findings were discussed in detail with the patient and his family. I recommended right hip hemiarthroplasty. The usual perioperative course was discussed. The risks and  benefits of surgical intervention were reviewed. They expressed understanding of the risks and benefits and agreed with plans for surgical intervention.   The surgical site was signed as per the "right site surgery" protocol.   The hospitalist has requested a Cardiology consult. We will proceed with surgery once the patient has been cleared by Cardiology.  Bernise Sylvain P. Angie Fava M.D.

## 2015-10-03 NOTE — Care Management (Signed)
Displaced right femoral neck fracture. Plan to surgery s/p cardiology clearance.

## 2015-10-03 NOTE — Anesthesia Preprocedure Evaluation (Signed)
Anesthesia Evaluation  Patient identified by MRN, date of birth, ID band Patient awake    Reviewed: Allergy & Precautions, H&P , NPO status , Patient's Chart, lab work & pertinent test results  History of Anesthesia Complications Negative for: history of anesthetic complications  Airway Mallampati: III  TM Distance: <3 FB Neck ROM: limited    Dental no notable dental hx. (+) Poor Dentition, Chipped, Missing   Pulmonary neg pulmonary ROS, neg shortness of breath,    Pulmonary exam normal breath sounds clear to auscultation       Cardiovascular Exercise Tolerance: Poor (-) angina(-) Past MI Normal cardiovascular exam Rhythm:regular Rate:Normal     Neuro/Psych PSYCHIATRIC DISORDERS  Neuromuscular disease negative psych ROS   GI/Hepatic negative GI ROS, Neg liver ROS, neg GERD  ,  Endo/Other  negative endocrine ROS  Renal/GU      Musculoskeletal   Abdominal   Peds  Hematology negative hematology ROS (+)   Anesthesia Other Findings Past Medical History: No date: Alzheimer disease No date: Parkinson disease (HCC)  Past Surgical History: No date: HIP SURGERY     Comment: x2; now with left total hip arthroplasty with               cerclage cables  BMI    Body Mass Index:  20.98 kg/m      Reproductive/Obstetrics negative OB ROS                             Anesthesia Physical Anesthesia Plan  ASA: III  Anesthesia Plan: Spinal   Post-op Pain Management:    Induction:   Airway Management Planned:   Additional Equipment:   Intra-op Plan:   Post-operative Plan:   Informed Consent: I have reviewed the patients History and Physical, chart, labs and discussed the procedure including the risks, benefits and alternatives for the proposed anesthesia with the patient or authorized representative who has indicated his/her understanding and acceptance.     Plan Discussed with:  Anesthesiologist, CRNA and Surgeon  Anesthesia Plan Comments: (Consent from patients son)        Anesthesia Quick Evaluation

## 2015-10-03 NOTE — Clinical Social Work Placement (Signed)
   CLINICAL SOCIAL WORK PLACEMENT  NOTE  Date:  10/03/2015  Patient Details  Name: Derek Hendricks MRN: [id number] Date of Birth: 02-24-1940  Clinical Social Work is seeking post-discharge placement for this patient at the Skilled  Nursing Facility level of care (*CSW will initial, date and re-position this form in  chart as items are completed):  Yes   Patient/family provided with [facility] Clinical Social Work Department's list of facilities offering this level of care within the geographic area requested by the patient (or if unable, by the patient's family).  Yes   Patient/family informed of their freedom to choose among providers that offer the needed level of care, that participate in Medicare, Medicaid or managed care program needed by the patient, have an available bed and are willing to accept the patient.  Yes   Patient/family informed of [facility]'s ownership interest in [facility] and [facility], as well as of the fact that they are under no obligation to receive care at these facilities.  PASRR submitted to EDS on 10/03/15     PASRR number received on 10/03/15     Existing PASRR number confirmed on       FL2 transmitted to all facilities in geographic area requested by pt/family on 10/03/15     FL2 transmitted to all facilities within larger geographic area on       Patient informed that his/her managed care company has contracts with or will negotiate with certain facilities, including the following:            Patient/family informed of bed offers received.  Patient chooses bed at       Physician recommends and patient chooses bed at      Patient to be transferred to   on  .  Patient to be transferred to facility by       Patient family notified on   of transfer.  Name of family member notified:        PHYSICIAN       Additional Comment:    _______________________________________________ Nathalya Wolanski, Darleen CrockerBailey M, LCSW 10/03/2015, 4:39 PM

## 2015-10-03 NOTE — [place] FL2 (Signed)
[place] MEDICAID FL2 LEVEL OF CARE SCREENING TOOL     IDENTIFICATION  Patient Name: Derek Hendricks Birthdate: [birth year] Sex: male Admission Date (Current Location): 10/02/2015  [place] and [place] Number:  Chiropodist and Address:  [facility], [address] [place], [place] [address]      Provider Number: [id number]  Attending Physician Name and Address:  Auburn Bilberry, MD  Relative Name and Phone Number:       Current Level of Care: Hospital Recommended Level of Care: Skilled Nursing Facility Prior Approval Number:    Date Approved/Denied:   PASRR Number:  [id number]  Discharge Plan: SNF    Current Diagnoses: Patient Active Problem List   Diagnosis Date Noted  . Hip fracture (HCC) 10/02/2015    Orientation RESPIRATION BLADDER Height & Weight     Self, Time, Situation, Place  Normal External catheter Weight: 130 lb (59 kg) Height:  5\' 6"  (167.6 cm)  BEHAVIORAL SYMPTOMS/MOOD NEUROLOGICAL BOWEL NUTRITION STATUS   (None. )  (None. ) Continent Diet (Diet: NPO)  AMBULATORY STATUS COMMUNICATION OF NEEDS Skin   Extensive Assist Verbally Normal                       Personal Care Assistance Level of Assistance  Bathing, Feeding, Dressing Bathing Assistance: Limited assistance Feeding assistance: Independent Dressing Assistance: Limited assistance     Functional Limitations Info  Sight, Hearing, Speech Sight Info: Adequate Hearing Info: Adequate Speech Info: Impaired (Missing Teeth )    SPECIAL CARE FACTORS FREQUENCY  PT (By licensed PT), OT (By licensed OT)     PT Frequency:  (5) OT Frequency:  (5)            Contractures      Additional Factors Info  Code Status, Allergies (Full Code. )   Allergies Info:  (No Known Allergies. )           Current Medications (10/03/2015):  This is the current hospital active medication list Current Facility-Administered Medications  Medication Dose Route  Frequency Provider Last Rate Last Dose  . 0.9 %  sodium chloride infusion   Intravenous Continuous Alexis Hugelmeyer, DO 75 mL/hr at 10/03/15 1356    . acetaminophen (TYLENOL) tablet 650 mg  650 mg Oral Q6H PRN Alexis Hugelmeyer, DO       Or  . acetaminophen (TYLENOL) suppository 650 mg  650 mg Rectal Q6H PRN Alexis Hugelmeyer, DO      . bisacodyl (DULCOLAX) EC tablet 5 mg  5 mg Oral Daily PRN Alexis Hugelmeyer, DO      . carbidopa-levodopa (SINEMET IR) 25-250 MG per tablet immediate release 1 tablet  1 tablet Oral TID Alexis Hugelmeyer, DO   1 tablet at 10/03/15 0854  . ceFAZolin (ANCEF) IVPB 2g/100 mL premix  2 g Intravenous Once Auburn Bilberry, MD      . donepezil (ARICEPT) tablet 10 mg  10 mg Oral QHS Alexis Hugelmeyer, DO      . enoxaparin (LOVENOX) injection 40 mg  40 mg Subcutaneous Q24H Alexis Hugelmeyer, DO      . HYDROcodone-acetaminophen (NORCO/VICODIN) 5-325 MG per tablet 1-2 tablet  1-2 tablet Oral Q4H PRN Alexis Hugelmeyer, DO   1 tablet at 10/03/15 0608  . [START ON 10/04/2015] Influenza vac split quadrivalent PF (FLUARIX) injection 0.5 mL  0.5 mL Intramuscular Tomorrow-1000 Alexis Hugelmeyer, DO      . magnesium citrate solution 1 Bottle  1 Bottle Oral Once PRN [employer]  Hugelmeyer, DO      . morphine 2 MG/ML injection 1 mg  1 mg Intravenous Q4H PRN Alexis Hugelmeyer, DO      . ondansetron (ZOFRAN) tablet 4 mg  4 mg Oral Q6H PRN Alexis Hugelmeyer, DO       Or  . ondansetron (ZOFRAN) injection 4 mg  4 mg Intravenous Q6H PRN Alexis Hugelmeyer, DO      . senna-docusate (Senokot-S) tablet 1 tablet  1 tablet Oral QHS PRN Alexis Hugelmeyer, DO      . zolpidem (AMBIEN) tablet 5 mg  5 mg Oral QHS PRN Alexis Hugelmeyer, DO   5 mg at 10/02/15 2331     Discharge Medications: Please see discharge summary for a list of discharge medications.  Relevant Imaging Results:  Relevant Lab Results:   Additional Information  (SSN: [id number])  Ralene BatheMackenzie Tabbitha Janvrin, Student-Social Work

## 2015-10-03 NOTE — Clinical Social Work Note (Addendum)
Clinical Social Work Assessment  Patient Details  Name: Derek Derek MRN: [id number] Date of Birth: 02/01/1940  Date of referral:  10/03/15               Reason for consult:  Discharge Planning, Facility Placement                Permission sought to share information with:  Chartered certified accountant granted to share information::  Yes, Verbal Permission Granted  Name::        Agency::     Relationship::     Contact Information:     Housing/Transportation Living arrangements for the past 2 months:  Single Family Home Source of Information:  Other (Comment Required) (Son-in-Law Derek Hendricks) Patient Interpreter Needed:  None Criminal Activity/Legal Involvement Pertinent to Current Situation/Hospitalization:  No - Comment as needed Significant Relationships:  Adult Children, Other Family Members, Spouse Lives with:  Adult Children, Spouse Do you feel safe going back to the place where you live?  Yes Need for family participation in patient care:  Yes (Comment)  Care giving concerns: Patient lives in [place] with daughter Derek Derek, and wife.    Social Worker assessment / plan:  Social work Theatre manager received SNF consult. Per chart patient is having surgery today. Social work Theatre manager met with patient and patients son-in-law, Derek Derek, at beside. Patient was unable to participate in assessment due to sleeping. Social work Theatre manager explained the role of social work. Son-in-law reported that patients lives in [place] with him, daughter Derek Derek, and patients wife. Patient has four adult children that all live in [place]- 3 daughters and 1 son. Social work Theatre manager talked to patients son-in-law about discharge plans. Social work Theatre manager explained SNF option and that patient's insurance will have to approve SNF stay. Patients son-in-law verbally agreed that the family would send patient to a SNF in [place]. Social work Theatre manager provided patients son-in-law SNF list.   Fl2  complete and faxed out. Per Verdell Face case manager patient has out of state [place] that does not go through her.   Employment status:  Retired Forensic scientist:  Facilities manager ) PT Recommendations:  [place] / Referral to community resources:  [place]  Patient/Family's Response to care:  Patients son-in-law agreed to send patient to SNF in [place].   Patient/Family's Understanding of and Emotional Response to Diagnosis, Current Treatment, and Prognosis:  Patients son-in-law thanked social work Theatre manager for coming by.   Emotional Assessment Appearance:  Appears stated age Attitude/Demeanor/Rapport:  Unable to Assess Affect (typically observed):  Unable to Assess Orientation:  Fluctuating Orientation (Suspected and/or reported Sundowners), Oriented to Self Alcohol / Substance use:  Not Applicable Psych involvement (Current and /or in the community):  No (Comment)  Discharge Needs  Concerns to be addressed:  Discharge Planning Concerns, Basic Needs Readmission within the last 30 days:  No Current discharge risk:  None Barriers to Discharge:  Continued Medical Work up   [employer], [place] Work 10/03/2015, 3:35 PM

## 2015-10-03 NOTE — Anesthesia Procedure Notes (Signed)
Procedure Name: Intubation Performed by: Takoda Janowiak Pre-anesthesia Checklist: Patient identified, Patient being monitored, Timeout performed, Emergency Drugs available and Suction available Patient Re-evaluated:Patient Re-evaluated prior to inductionOxygen Delivery Method: Circle system utilized Preoxygenation: Pre-oxygenation with 100% oxygen Intubation Type: IV induction Ventilation: Mask ventilation without difficulty Laryngoscope Size: Mac and 3 Grade View: Grade II Tube type: Oral Tube size: 7.0 mm Number of attempts: 1 Airway Equipment and Method: Stylet Placement Confirmation: ETT inserted through vocal cords under direct vision,  positive ETCO2 and breath sounds checked- equal and bilateral Secured at: 23 cm Tube secured with: Tape Dental Injury: Teeth and Oropharynx as per pre-operative assessment        

## 2015-10-04 ENCOUNTER — Encounter: Payer: Self-pay | Admitting: Orthopedic Surgery

## 2015-10-04 ENCOUNTER — Inpatient Hospital Stay: Payer: Medicare Other

## 2015-10-04 LAB — CBC
HCT: 29.9 % — ABNORMAL LOW (ref 40.0–52.0)
HEMOGLOBIN: 10 g/dL — AB (ref 13.0–18.0)
MCH: 30 pg (ref 26.0–34.0)
MCHC: 33.5 g/dL (ref 32.0–36.0)
MCV: 89.5 fL (ref 80.0–100.0)
PLATELETS: 204 10*3/uL (ref 150–440)
RBC: 3.34 MIL/uL — AB (ref 4.40–5.90)
RDW: 15.1 % — ABNORMAL HIGH (ref 11.5–14.5)
WBC: 10.5 10*3/uL (ref 3.8–10.6)

## 2015-10-04 LAB — BASIC METABOLIC PANEL
ANION GAP: 4 — AB (ref 5–15)
BUN: 24 mg/dL — ABNORMAL HIGH (ref 6–20)
CHLORIDE: 110 mmol/L (ref 101–111)
CO2: 24 mmol/L (ref 22–32)
Calcium: 8 mg/dL — ABNORMAL LOW (ref 8.9–10.3)
Creatinine, Ser: 0.73 mg/dL (ref 0.61–1.24)
GFR calc Af Amer: >60 mL/min (ref >60)
GLUCOSE: 104 mg/dL — AB (ref 65–99)
POTASSIUM: 3.6 mmol/L (ref 3.5–5.1)
Sodium: 138 mmol/L (ref 135–145)

## 2015-10-04 MED ORDER — SUGAMMADEX SODIUM 200 MG/2ML IV SOLN
INTRAVENOUS | Status: DC | PRN
  Administered 2015-10-04: 118 mg via INTRAVENOUS

## 2015-10-04 MED ORDER — TRAMADOL HCL 50 MG PO TABS: 50.0000 mg | ORAL_TABLET | ORAL | Status: DC | PRN

## 2015-10-04 MED ORDER — PHENYLEPHRINE HCL 10 MG/ML IJ SOLN
INTRAMUSCULAR | Status: DC | PRN
  Administered 2015-10-04: .2 ug/min via INTRAVENOUS

## 2015-10-04 MED ORDER — METOCLOPRAMIDE HCL 5 MG/ML IJ SOLN: 5.0000 mg | Freq: Three times a day (TID) | INTRAMUSCULAR | Status: DC | PRN

## 2015-10-04 MED ORDER — SENNOSIDES-DOCUSATE SODIUM 8.6-50 MG PO TABS
1.0000 | ORAL_TABLET | Freq: Two times a day (BID) | ORAL | Status: DC
  Administered 2015-10-04 – 2015-10-07 (×7): 1 via ORAL
  Filled 2015-10-04 (×6): qty 1

## 2015-10-04 MED ORDER — ENOXAPARIN SODIUM 40 MG/0.4ML ~~LOC~~ SOLN
40.0000 mg | SUBCUTANEOUS | Status: DC
  Administered 2015-10-05 – 2015-10-07 (×3): 40 mg via SUBCUTANEOUS
  Filled 2015-10-04 (×3): qty 0.4

## 2015-10-04 MED ORDER — NEOMYCIN-POLYMYXIN B GU 40-200000 IR SOLN
Status: DC | PRN
  Administered 2015-10-04: 12 mL

## 2015-10-04 MED ORDER — ACETAMINOPHEN 650 MG RE SUPP: 650.0000 mg | Freq: Four times a day (QID) | RECTAL | Status: DC | PRN

## 2015-10-04 MED ORDER — MORPHINE SULFATE (PF) 2 MG/ML IV SOLN: 2.0000 mg | INTRAVENOUS | Status: DC | PRN

## 2015-10-04 MED ORDER — FERROUS SULFATE 325 (65 FE) MG PO TABS
325.0000 mg | ORAL_TABLET | Freq: Two times a day (BID) | ORAL | Status: DC
  Administered 2015-10-04 – 2015-10-07 (×7): 325 mg via ORAL
  Filled 2015-10-04 (×7): qty 1

## 2015-10-04 MED ORDER — ACETAMINOPHEN 10 MG/ML IV SOLN
1000.0000 mg | Freq: Four times a day (QID) | INTRAVENOUS | Status: AC
  Administered 2015-10-04 (×4): 1000 mg via INTRAVENOUS
  Filled 2015-10-04 (×4): qty 100

## 2015-10-04 MED ORDER — OXYCODONE HCL 5 MG PO TABS: 5.0000 mg | ORAL_TABLET | Freq: Once | ORAL | Status: DC | PRN

## 2015-10-04 MED ORDER — FENTANYL CITRATE (PF) 100 MCG/2ML IJ SOLN
25.0000 ug | INTRAMUSCULAR | Status: DC | PRN
  Administered 2015-10-04 (×2): 25 ug via INTRAVENOUS

## 2015-10-04 MED ORDER — MENTHOL 3 MG MT LOZG
1.0000 | LOZENGE | OROMUCOSAL | Status: DC | PRN
Start: 2015-10-04 — End: 2015-10-07
  Filled 2015-10-04: qty 9

## 2015-10-04 MED ORDER — ONDANSETRON HCL 4 MG/2ML IJ SOLN: 4.0000 mg | Freq: Four times a day (QID) | INTRAMUSCULAR | Status: DC | PRN

## 2015-10-04 MED ORDER — MAGNESIUM HYDROXIDE 400 MG/5ML PO SUSP
30.0000 mL | Freq: Every day | ORAL | Status: DC | PRN
  Administered 2015-10-05: 30 mL via ORAL
  Filled 2015-10-04: qty 30

## 2015-10-04 MED ORDER — PHENOL 1.4 % MT LIQD
1.0000 | OROMUCOSAL | Status: DC | PRN
  Filled 2015-10-04: qty 177

## 2015-10-04 MED ORDER — CEFAZOLIN SODIUM-DEXTROSE 2-4 GM/100ML-% IV SOLN
2.0000 g | Freq: Four times a day (QID) | INTRAVENOUS | Status: AC
  Administered 2015-10-04 (×4): 2 g via INTRAVENOUS
  Filled 2015-10-04 (×4): qty 100

## 2015-10-04 MED ORDER — METOCLOPRAMIDE HCL 10 MG PO TABS: 5.0000 mg | ORAL_TABLET | Freq: Three times a day (TID) | ORAL | Status: DC | PRN

## 2015-10-04 MED ORDER — FENTANYL CITRATE (PF) 100 MCG/2ML IJ SOLN
INTRAMUSCULAR | Status: AC
  Filled 2015-10-04: qty 2

## 2015-10-04 MED ORDER — OXYCODONE HCL 5 MG/5ML PO SOLN: 5.0000 mg | Freq: Once | ORAL | Status: DC | PRN

## 2015-10-04 MED ORDER — FLEET ENEMA 7-19 GM/118ML RE ENEM: 1.0000 | ENEMA | Freq: Once | RECTAL | Status: DC | PRN

## 2015-10-04 MED ORDER — ONDANSETRON HCL 4 MG PO TABS: 4.0000 mg | ORAL_TABLET | Freq: Four times a day (QID) | ORAL | Status: DC | PRN

## 2015-10-04 MED ORDER — SODIUM CHLORIDE 0.9 % IV SOLN
INTRAVENOUS | Status: DC
  Administered 2015-10-04: 02:00:00 via INTRAVENOUS
  Administered 2015-10-04: 100 mL/h via INTRAVENOUS
  Administered 2015-10-04: 10:00:00 via INTRAVENOUS

## 2015-10-04 MED ORDER — METOCLOPRAMIDE HCL 10 MG PO TABS
10.0000 mg | ORAL_TABLET | Freq: Three times a day (TID) | ORAL | Status: AC
  Administered 2015-10-04 – 2015-10-05 (×7): 10 mg via ORAL
  Filled 2015-10-04 (×7): qty 1

## 2015-10-04 MED ORDER — OXYCODONE HCL 5 MG PO TABS
5.0000 mg | ORAL_TABLET | ORAL | Status: DC | PRN
  Administered 2015-10-04 (×3): 5 mg via ORAL
  Filled 2015-10-04 (×2): qty 1

## 2015-10-04 MED ORDER — BISACODYL 10 MG RE SUPP
10.0000 mg | Freq: Every day | RECTAL | Status: DC | PRN
  Administered 2015-10-05: 10 mg via RECTAL
  Filled 2015-10-04: qty 1

## 2015-10-04 MED ORDER — ACETAMINOPHEN 10 MG/ML IV SOLN
INTRAVENOUS | Status: DC | PRN
  Administered 2015-10-04: 1000 mg via INTRAVENOUS

## 2015-10-04 MED ORDER — PANTOPRAZOLE SODIUM 40 MG PO TBEC
40.0000 mg | DELAYED_RELEASE_TABLET | Freq: Two times a day (BID) | ORAL | Status: DC
  Administered 2015-10-04 – 2015-10-07 (×7): 40 mg via ORAL
  Filled 2015-10-04 (×7): qty 1

## 2015-10-04 MED ORDER — ACETAMINOPHEN 325 MG PO TABS: 650.0000 mg | ORAL_TABLET | Freq: Four times a day (QID) | ORAL | Status: DC | PRN

## 2015-10-04 NOTE — Evaluation (Signed)
Occupational Therapy Evaluation Patient Details Name: Derek Hendricks MRN: [id number] DOB: Feb 02, 1940 Today's Date: 10/04/2015    History of Present Illness Pt. is a 75 y.o. male who was admitted to [facility] after falling and sustaining a right hip fx s/p hemiarthroplasty repair.   Clinical Impression   Pt. Is a 75 y.o. Male who was admitted to [facility] after falling and sustaining a right Hip Fx s/p Hemiarthroplasty repair. Pt. 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 Pt. education was provided about A/E use for LE ADL tasks. Pt. education was provided about general reacher training. Pt. Required max cues to attempt basic reacher training in preparation for use during ADL tasks. Pt. Could benefit from skilled OT services to follow up at the next level of care.    Follow Up Recommendations  SNF  Equipment Recommendations       Recommendations for Other Services       Precautions / Restrictions Precautions Precautions: Fall Restrictions Weight Bearing Restrictions: Yes RLE Weight Bearing: Weight bearing as tolerated              ADL Overall ADL's : Needs assistance/impaired Eating/Feeding: Set up   Grooming: Set up           Upper Body Dressing : Maximal assistance   Lower Body Dressing: Maximal assistance                 General ADL Comments: Pt. education was provided A/E use for LE ADLs., Pt. edcuation was provided about general reacher use.     Vision     Perception     Praxis      Pertinent Vitals/Pain Pain Assessment: 0-10 Pain Score: 3  Pain Location: Right Hip Pain Descriptors / Indicators: Aching     Hand Dominance Left   Extremity/Trunk Assessment Upper Extremity Assessment Upper Extremity Assessment: Generalized weakness (Resting tremors.)       Communication Communication Communication: Prefers language other than English    Cognition Arousal/Alertness: Lethargic Behavior During Therapy: WFL for tasks assessed/performed Overall Cognitive Status: History of cognitive impairments - at baseline                     General Comments       Exercises     Shoulder Instructions      Home Living Family/patient expects to be discharged to:: Skilled nursing facility Living Arrangements: Children                               Additional Comments: Pt has cane and walker      Prior Functioning/Environment Level of Independence: Independent with assistive device(s)        Comments: Apparently pt was able to ambulate with cane w/o issue, getting in/out of the house and generally did fairly well        OT Problem List:     OT Treatment/Interventions:      OT Goals(Current goals can be found in the care plan section) Acute Rehab OT Goals Patient Stated Goal: To get better OT Goal Formulation: With patient Potential to Achieve Goals: Good  OT Frequency:     Barriers to D/C:            Co-evaluation              End of Session    Activity Tolerance:  Pt. Tolerated well. Patient left:  In chair with call bell in reach, chair alarm   Time: 1345-1405 OT Time Calculation (min): 20 min Charges:  OT General Charges $OT Visit: 1 Procedure OT Evaluation $OT Eval Moderate Complexity: 1 Procedure G-Codes:    Olegario MessierElaine Eastin Swing, MS, OTR/L 10/04/2015, 2:22 PM

## 2015-10-04 NOTE — Progress Notes (Signed)
[facility] -  at [facility]   PATIENT NAME: Derek Hendricks    MR#:  [id number]  DATE OF BIRTH:  1940-03-25  SUBJECTIVE:  I speak the same language so did not use an interpreter POD#1 Right femoral neck fracture Denies any complaints. dter in the room  REVIEW OF SYSTEMS:   Review of Systems  Constitutional: Negative for chills, fever and weight loss.  HENT: Negative for ear discharge, ear pain and nosebleeds.   Eyes: Negative for blurred vision, pain and discharge.  Respiratory: Negative for sputum production, shortness of breath, wheezing and stridor.   Cardiovascular: Negative for chest pain, palpitations, orthopnea and PND.  Gastrointestinal: Negative for abdominal pain, diarrhea, nausea and vomiting.  Genitourinary: Negative for frequency and urgency.  Musculoskeletal: Positive for joint pain. Negative for back pain.  Neurological: Positive for tremors and weakness. Negative for sensory change, speech change and focal weakness.  Psychiatric/Behavioral: Negative for depression and hallucinations. The patient is not nervous/anxious.    Tolerating Diet:yes Tolerating PT: pending  DRUG ALLERGIES:  No Known Allergies  VITALS:  Blood pressure (!) 121/42, pulse 76, temperature 97.7 F (36.5 C), temperature source Oral, resp. rate 16, height 5\' 6"  (1.676 m), weight 59 kg (130 lb), SpO2 100 %.  PHYSICAL EXAMINATION:   Physical Exam  GENERAL:  75 y.o.-year-old patient lying in the bed with no acute distress. THin EYES: Pupils equal, round, reactive to light and accommodation. No scleral icterus. Extraocular muscles intact.  HEENT: Head atraumatic, normocephalic. Oropharynx and nasopharynx clear.  NECK:  Supple, no jugular venous distention. No thyroid enlargement, no tenderness.  LUNGS: Normal breath sounds bilaterally, no wheezing, rales, rhonchi. No use of accessory muscles of respiration.  CARDIOVASCULAR: S1, S2 normal. No murmurs, rubs, or  gallops.  ABDOMEN: Soft, nontender, nondistended. Bowel sounds present. No organomegaly or mass.  EXTREMITIES: No cyanosis, clubbing or edema b/l. Right hip surgical dressing+    NEUROLOGIC: Cranial nerves II through XII are intact. No focal Motor or sensory deficits b/l.  Tremors+ PSYCHIATRIC:  patient is alert and oriented x 3.  SKIN: No obvious rash, lesion, or ulcer.   LABORATORY PANEL:  CBC  Recent Labs Lab 10/04/15 0350  WBC 10.5  HGB 10.0*  HCT 29.9*  PLT 204    Chemistries   Recent Labs Lab 10/02/15 2022  10/04/15 0350  NA 139  < > 138  K 3.7  < > 3.6  CL 107  < > 110  CO2 27  < > 24  GLUCOSE 145*  < > 104*  BUN 47*  < > 24*  CREATININE 0.97  < > 0.73  CALCIUM 9.1  < > 8.0*  MG 2.3  --   --   < > = values in this interval not displayed. Cardiac Enzymes No results for input(s): TROPONINI in the last 168 hours. RADIOLOGY:  Dg Chest 1 View  Result Date: 10/02/2015 CLINICAL DATA:  75 year old who fell 2 days ago and injured the right lower extremity. Painful ambulation since that time. Initial encounter. Preoperative evaluation. EXAM: CHEST 1 VIEW COMPARISON:  None. FINDINGS: Cardiac silhouette mildly enlarged for AP supine technique. Thoracic aorta atherosclerotic. Prominent bronchovascular markings diffusely and moderate central peribronchial thickening. Lungs otherwise clear. No localized airspace consolidation. No pleural effusions. No pneumothorax. Normal pulmonary vascularity. IMPRESSION: Mild cardiomegaly. Moderate changes of bronchitis and/or asthma which may be acute or chronic. No acute cardiopulmonary disease otherwise. Electronically Signed   By: Hulan Saashomas  Lawrence M.D.   On: 10/02/2015  19:49   Dg Hip Port Unilat With Pelvis 1v Right  Result Date: 10/04/2015 CLINICAL DATA:  Status post right hip arthroplasty. Initial encounter. EXAM: DG HIP (WITH OR WITHOUT PELVIS) 1V PORT RIGHT COMPARISON:  Right hip radiographs performed 10/02/2015 FINDINGS: The patient is  status post right hip arthroplasty. The arthroplasty appears grossly intact, without evidence of loosening. Overlying postoperative air and skin staples are seen, with associated drains. The left hip arthroplasty is grossly unremarkable in appearance, with associated cerclage wires. Diffuse vascular calcifications are noted. The visualized bowel gas pattern is grossly unremarkable. IMPRESSION: 1. Status post right hip arthroplasty. No evidence of fracture or loosening. 2. Diffuse vascular calcifications seen. Electronically Signed   By: Roanna Raider M.D.   On: 10/04/2015 02:30   Dg Hip Unilat W Or Wo Pelvis 2-3 Views Right  Result Date: 10/02/2015 CLINICAL DATA:  75 year old who fell 2 days ago and injured the right lower extremity. Painful ambulation since that time. Initial encounter. EXAM: DG HIP (WITH OR WITHOUT PELVIS) 2-3V RIGHT COMPARISON:  None. FINDINGS: Acute subcapital right femoral neck fracture. Right hip joint anatomically aligned with mild axial joint space narrowing. No other fractures. Included AP pelvis demonstrates a left total hip arthroplasty with anatomic alignment. Sacroiliac joints intact. Symphysis pubis intact. Visualized lower lumbar spine intact. Osseous demineralization. IMPRESSION: Acute comminuted subcapital right femoral neck fracture. Electronically Signed   By: Hulan Saas M.D.   On: 10/02/2015 19:47   Dg Femur Min 2 Views Right  Result Date: 10/02/2015 CLINICAL DATA:  75 year old who fell 2 days ago and injured the right lower extremity. Painful ambulation since that time. Initial encounter. EXAM: RIGHT FEMUR 2 VIEWS COMPARISON:  Right hip x-rays obtained concurrently. FINDINGS: Subcapital right femoral neck fracture as detailed on the report of the right hip images. No fractures elsewhere involving the femur. Moderate medial compartment joint space narrowing at the knee. IMPRESSION: Subcapital right femoral neck fracture as detailed on the report of the right hip  images. No other fractures involving the femur. Electronically Signed   By: Hulan Saas M.D.   On: 10/02/2015 19:48   ASSESSMENT AND PLAN:   75 y.o.malewith a history of Alzheimer's dementia and Parkinson'snow being admitted with: 1. Right femoral neck fracture POD#1 Patient currently has no cardiopulmonary symptoms, no cardiac history PT to be started -prn pain meds  2. Hyponatremia, mild-gentle IV normal saline  3. History of Alzheimer's dementia-continue Sinemet and Aricept.  4. Post op mild anemia -transfuse as needed  Case discussed with Care Management/Social Worker. Management plans discussed with the patient, family and they are in agreement.  CODE STATUS:Full  DVT Prophylaxis:lovenox  TOTAL TIME TAKING CARE OF THIS PATIENT: 30 minutes.  >50% time spent on counselling and coordination of care with pt and Dter  POSSIBLE D/C IN 1-2 DAYS, DEPENDING ON CLINICAL CONDITION.  Note: This dictation was prepared with Dragon dictation along with smaller phrase technology. Any transcriptional errors that result from this process are unintentional.  Ecklund,Aleja Yearwood M.D on 10/04/2015 at 10:02 AM  Between 7am to 6pm - Pager - [phone]  After 6pm go to [email or web address] - password [email or web address] [facility]  [facility]  Office  [phone]  CC: Primary care physician; No primary care provider on file.

## 2015-10-04 NOTE — Anesthesia Postprocedure Evaluation (Signed)
Anesthesia Post Note  Patient: Derek Hendricks  Procedure(s) Performed: Procedure(s) (LRB): ARTHROPLASTY BIPOLAR HIP (HEMIARTHROPLASTY) (Right)  Patient location during evaluation: Nursing Unit Anesthesia Type: Spinal Level of consciousness: responds to stimulation and patient cooperative Pain management: satisfactory to patient Vital Signs Assessment: post-procedure vital signs reviewed and stable Respiratory status: spontaneous breathing, nonlabored ventilation and respiratory function stable Cardiovascular status: stable Postop Assessment: adequate PO intake, no signs of nausea or vomiting and patient able to bend at knees Anesthetic complications: no    Last Vitals:  Vitals:   10/04/15 0606 10/04/15 0653  BP: 126/66 (!) 121/42  Pulse: 77 76  Resp: 16 16  Temp: 36.6 C 36.5 C    Last Pain:  Vitals:   10/04/15 0653  TempSrc: Oral  PainSc:                  Marlana SalvageSandra Humbert Morozov

## 2015-10-04 NOTE — Progress Notes (Signed)
PT is recommending SNF. Clinical Social Worker (CSW) met with patient's daughter Roselyn Reef [phone] and presented bed offers. Per daughter she will discuss offers with her husband and get back to CSW around 1 pm today. CSW explained that patient's insurance will require authorization and may be out of network with the local facilities her. Daughter verbalized her understanding. CSW will continue to follow and assist as needed.   [employer], LCSW [phone]

## 2015-10-04 NOTE — Care Management Important Message (Signed)
Important Message  Patient Details  Name: Derek Hendricks MRN: [id number] Date of Birth: [birth year]   Medicare Important Message Given:  Yes    Collie SiadAngela Lyrical Sowle, RN 10/04/2015, 11:34 AM

## 2015-10-04 NOTE — Progress Notes (Signed)
Chaplain was making rounds when he visited the patient. Chaplain interacted with patient's family member who was in the room but was not able to talk to the patient as the patient was asleep or resting.

## 2015-10-04 NOTE — Brief Op Note (Signed)
10/02/2015 - 10/04/2015  1:17 AM  PATIENT:  Jobie Quakeramesh S Drakes  75 y.o. male  PRE-OPERATIVE DIAGNOSIS:  right femoral neck fracture  POST-OPERATIVE DIAGNOSIS:  right femoral neck fracture  PROCEDURE:  Right hip hemiarthroplasty  SURGEON:  Surgeon(s) and Role:    * Donato HeinzJames P Hooten, MD - Primary  ASSISTANTS: none   ANESTHESIA:   general  EBL:  Total I/O In: 1500 [I.V.:1500] Out: 650 [Urine:300; Blood:350]  BLOOD ADMINISTERED:none  DRAINS: 2 medium Hemovac   LOCAL MEDICATIONS USED:  NONE  SPECIMEN:  Source of Specimen:  Right femoral head  DISPOSITION OF SPECIMEN:  PATHOLOGY  COUNTS:  YES  TOURNIQUET:  * No tourniquets in log *  DICTATION: .Dragon Dictation  PLAN OF CARE: Admit to inpatient   PATIENT DISPOSITION:  PACU - hemodynamically stable.   Delay start of Pharmacological VTE agent (>24hrs) due to surgical blood loss or risk of bleeding: yes

## 2015-10-04 NOTE — Progress Notes (Signed)
Physical Therapy Treatment Patient Details Name: Derek Hendricks MRN: [id number] DOB: Jul 17, 1940 Today's Date: 10/04/2015    History of Present Illness Pt. is a 75 y.o. male who was [facility] after falling and sustaining a right hip fx s/p hemiarthroplasty repair.    PT Comments     Pt in chair, fatigued and ready to return to bed.  Sat on edge of chair for AAROM RLE prior to standing.  He was able to stand with mod a x 1 and take several small shuffling steps 3' to bed.  He did well maintaining his balance with only mod a this pm.  Returned to supine with max a x 1 and max a for rolling and repositioning in bed.  Positioned with abduction pillow in supine.   Follow Up Recommendations  SNF     Equipment Recommendations       Recommendations for Other Services       Precautions / Restrictions Precautions Precautions: Fall Restrictions Weight Bearing Restrictions: Yes RLE Weight Bearing: Weight bearing as tolerated    Mobility  Bed Mobility Overal bed mobility: Needs Assistance Bed Mobility: Sit to Supine     Supine to sit: Max assist        Transfers Overall transfer level: Needs assistance Equipment used: Rolling walker (2 wheeled) Transfers: Sit to/from Stand Sit to Stand: Mod assist            Ambulation/Gait Ambulation/Gait assistance: Mod assist;Max assist Ambulation Distance (Feet): 3 Feet Assistive device: Rolling walker (2 wheeled) Gait Pattern/deviations: Step-to pattern   Gait velocity interpretation: <1.8 ft/sec, indicative of risk for recurrent falls General Gait Details: Pt with very choppy/shuffling gait (baseline Parkinson's).  Pt with posterior lean and needing considerable verbal and tactile cues for safety   Stairs            Wheelchair Mobility    Modified Rankin (Stroke Patients Only)       Balance Overall balance assessment: Needs assistance Sitting-balance support: Feet supported Sitting balance-Leahy Scale:  Fair     Standing balance support: Bilateral upper extremity supported Standing balance-Leahy Scale: Poor                      Cognition Arousal/Alertness: Awake/alert Behavior During Therapy: WFL for tasks assessed/performed Overall Cognitive Status: History of cognitive impairments - at baseline                      Exercises Other Exercises Other Exercises: participated in seated AAROM RLE for LAQ and ankle pumps prior to standing.    General Comments        Pertinent Vitals/Pain Pain Assessment: 0-10 Pain Score: 3  Pain Location: R hip Pain Descriptors / Indicators: Aching    Home Living Family/patient expects to be discharged to:: Skilled nursing facility Living Arrangements: Children                  Prior Function Level of Independence: Independent with assistive device(s)          PT Goals (current goals can now be found in the care plan section) Acute Rehab PT Goals Patient Stated Goal: To get better    Frequency    BID      PT Plan Current plan remains appropriate    Co-evaluation             End of Session Equipment Utilized During Treatment: Gait belt Activity Tolerance: Patient limited by lethargy;Patient limited  by fatigue Patient left: with family/visitor present;with call bell/phone within reach;with bed alarm set;in bed     Time: 1420-1445 PT Time Calculation (min) (ACUTE ONLY): 25 min  Charges:  $Therapeutic Exercise: 8-22 mins $Therapeutic Activity: 8-22 mins                    G Codes:      Danielle DessSarah Kadeen Sroka, PTA 10/04/15, 4:59 PM

## 2015-10-04 NOTE — Progress Notes (Signed)
Clinical Child psychotherapistocial Worker (CSW) received a call back from patient's daughter Asher MuirJamie stating that they have chosen [facility]. Plan is for patient to D/C to [facility] on Monday pending medical clearance. [facility] is starting authorization today. CSW will continue to follow and assist as needed.   [employer] Hughes IncorporatedBailey Dima Mini, LCSW [phone]

## 2015-10-04 NOTE — Transfer of Care (Signed)
Immediate Anesthesia Transfer of Care Note  Patient: Derek Hendricks  Procedure(s) Performed: Procedure(s): ARTHROPLASTY BIPOLAR HIP (HEMIARTHROPLASTY) (Right)  Patient Location: PACU  Anesthesia Type:General  Level of Consciousness: sedated  Airway & Oxygen Therapy: Patient Spontanous Breathing and Patient connected to face mask oxygen  Post-op Assessment: Report given to RN and Post -op Vital signs reviewed and stable  Post vital signs: Reviewed and stable  Last Vitals:  Vitals:   10/03/15 0454 10/03/15 0745  BP: 124/61 136/60  Pulse: 65 (!) 58  Resp: 18 18  Temp: 36.4 C 37.2 C    Last Pain:  Vitals:   10/03/15 0745  TempSrc: Oral  PainSc:          Complications: No apparent anesthesia complications

## 2015-10-04 NOTE — Progress Notes (Signed)
  Subjective: 1 Day Post-Op Procedure(s) (LRB): ARTHROPLASTY BIPOLAR HIP (HEMIARTHROPLASTY) (Right)  . pt sleeping . family members states he has been resting well.   Patient is well, and has had no acute complaints or problems We will start therapy today.  Plan is to go Rehab after hospital stay. no nausea and no vomiting Patient denies any chest pains or shortness of breath. Objective: Vital signs in last 24 hours: Temp:  [96.8 F (36 C)-98.9 F (37.2 C)] 97.7 F (36.5 C) (09/29 0653) Pulse Rate:  [54-132] 76 (09/29 0653) Resp:  [11-19] 16 (09/29 0653) BP: (76-157)/(40-66) 121/42 (09/29 0653) SpO2:  [97 %-100 %] 100 % (09/29 0653) well approximated incision Heels are non tender and elevated off the bed using rolled towels Intake/Output from previous day: 09/28 0701 - 09/29 0700 In: 2463.3 [I.V.:2363.3; IV Piggyback:100] Out: 950 [Urine:450; Drains:150; Blood:350] Intake/Output this shift: No intake/output data recorded.   Recent Labs  10/02/15 2022 10/03/15 0515 10/04/15 0350  HGB 12.0* 10.8* 10.0*    Recent Labs  10/03/15 0515 10/04/15 0350  WBC 6.6 10.5  RBC 3.55* 3.34*  HCT 30.8* 29.9*  PLT 210 204    Recent Labs  10/03/15 0515 10/04/15 0350  NA 139 138  K 3.5 3.6  CL 110 110  CO2 25 24  BUN 38* 24*  CREATININE 0.81 0.73  GLUCOSE 91 104*  CALCIUM 8.5* 8.0*    Recent Labs  10/03/15 0515  INR 1.04    EXAM General - Patient is sleeping Extremity - Intact pulses distally Compartment soft Dressing - dressing C/D/I Motor Function - intact, moving foot and toes well on exam.    Past Medical History:  Diagnosis Date  . Alzheimer disease   . Parkinson disease (HCC)     Assessment/Plan: 1 Day Post-Op Procedure(s) (LRB): ARTHROPLASTY BIPOLAR HIP (HEMIARTHROPLASTY) (Right) Active Problems:   Hip fracture (HCC)  Estimated body mass index is 20.98 kg/m as calculated from the following:   Height as of this encounter: 5\' 6"  (1.676 m).  Weight as of this encounter: 59 kg (130 lb). Advance diet Up with therapy D/C IV fluids Discharge to SNF  Labs: reviewed DVT Prophylaxis - Lovenox, Foot Pumps and TED hose Weight-Bearing as tolerated to right leg D/C O2 and Pulse OX and try on Room Air Begin working on a bowel movement Labs in am  State FarmJon R. Ascension St Francis HospitalWolfe PA [facility] 10/04/2015, 7:43 AM

## 2015-10-04 NOTE — Op Note (Addendum)
OPERATIVE NOTE  DATE OF SURGERY:  10/03/2015  PATIENT NAME:  Derek Hendricks   DOB: 1940-04-25  MRN: [id number]  PRE-OPERATIVE DIAGNOSIS: Right femoral neck fracture  POST-OPERATIVE DIAGNOSIS:  Same  PROCEDURE:  Right hip hemiarthroplasty  SURGEON:  Jena Gauss. M.D.  ANESTHESIA: general  ESTIMATED BLOOD LOSS: 350 mL  FLUIDS REPLACED: 1500 mL of crystalloid  DRAINS: 2 medium drains to a Hemovac reservoir  IMPLANTS UTILIZED: DePuy size 6 Summit femoral stem (cemented), 12 mm Cementralizer, 47 mm OD Cathcart hip ball, -3 mm tapered spacer, and a size 4 femoral cement restrictor  INDICATIONS FOR SURGERY: Derek Hendricks is a 75 y.o. year old male who fell and sustained a displaced right femoral neck fracture. After discussion of the risks and benefits of surgical intervention, the patient expressed understanding of the risks benefits and agree with plans for hip hemiarthroplasty.   The risks, benefits, and alternatives were discussed at length including but not limited to the risks of infection, bleeding, nerve injury, stiffness, blood clots, the need for revision surgery, limb length inequality, dislocation, cardiopulmonary complications, among others, and they were willing to proceed.  PROCEDURE IN DETAIL: The patient was brought into the operating room and, after adequate general anesthesia (after failed spinal anesthesia) was achieved, patient was placed in a left lateral decubitus position. Axillary roll was placed and all bony prominences were well-padded. The patient's right hip was cleaned and prepped with alcohol and DuraPrep and draped in the usual sterile fashion. A "timeout" was performed as per usual protocol. A lateral curvilinear incision was made gently curving towards the posterior superior iliac spine. The IT band was incised in line with the skin incision and the fibers of the gluteus maximus were split in line. The piriformis tendon was identified, skeletonized, and  incised at its insertion to the proximal femur and reflected posteriorly. A T type posterior capsulotomy was performed. The femoral head was then removed using a corkscrew device. The femoral head was measured using calipers and ring gauges and determined to be 47 mm in diameter.The femoral neck cut was performed using an oscillating saw. The acetabulum was inspected for any bony fragments. The articular surface was in good condition.  Attention was then directed to the proximal femur. A pilot hole for preparation of the proximal femoral canal was created using a high-speed bur. The femoral canal finder was inserted followed by insertion of the conical reamer. Serial broaches were inserted up to a size 6 broach. Calcar region was planed and a trial reduction was performed using a 47 mm OD Cathcart ball with a -3 mm neck length. Good equalization of limb lengths was appreciated and excellent stability was noted both anteriorly and posteriorly. Trial components were removed. The femoral canal was sized and was felt that a size 4 cement restrictor was appropriate. The cement restrictor was inserted to the appropriate depth in the femoral canal was irrigated with copious amounts of fluid using the pulse lavage and suctioned dry. The femoral canal was then packed with vaginal packing soaked in dilute Neo-Synephrine. Polymethylmethacrylate cement was prepared in the usual fashion using a vacuum mixer. Vaginal packing was removed and the canal again irrigated and suctioned dry. The polymethylmethacrylate cement was inserted in retrograde fashion and pressurized. The size 6 Summit femoral component with a 12 mm Cementralizer was positioned and impacted into place. Excess cement was removed using Personal assistant. After adequate curing of the cement, the Morse taper was cleaned and dried. A 47  mm outer diameter Cathcart hip ball with a -3 mm tapered spacer was placed on the trunnion and impacted into place. The acetabulum  was again irrigated and suctioned dry, making sure to inspect for any residal bony debris. The femoral head was then reduced and placed through a range of motion. Excellent stability was noted both anteriorly and posteriorly. Good equalization of limb lengths was appreciated.   The wound was irrigated with copious amounts of normal saline with antibiotic solution and suctioned dry. Good hemostasis was appreciated. The posterior capsulotomy was repaired using #5 Ethibond. Piriformis tendon was reapproximated to the undersurface of the gluteus medius tendon using #5 Ethibond. Two medium drains were placed in the wound bed and brought out through separate stab incisions to be attached to a Hemovac reservoir. The IT band was reapproximated using interrupted sutures of #1 Vicryl. Subcutaneous tissue was proximal phalanx using first #0 Vicryl followed by #2-0 Vicryl. The skin was closed with skin staples.  The patient tolerated the procedure well and was transported to the recovery room in stable condition.   Jena GaussJames P Kylor Valverde, Jr., M.D.

## 2015-10-04 NOTE — Evaluation (Signed)
Physical Therapy Evaluation Patient Details Name: Derek Hendricks MRN: [id number] DOB: 1940-06-24 Today's Date: 10/04/2015   History of Present Illness  75 y/o male who fell and suffered a R hip fx, came to [facility] days later needing a hemiarthroplasty.    Clinical Impression  Pt is lethargic and limited but does show good effort with all requested acts.  He needed AAROM for much of the exercises apart from the exam, but did show some AROM and limited against resistance effort.  Pt with very short, choppy steps and did not display good balance with "ambulation" to the recliner from the bed.      Follow Up Recommendations SNF    Equipment Recommendations       Recommendations for Other Services       Precautions / Restrictions Precautions Precautions: Fall Restrictions RLE Weight Bearing: Weight bearing as tolerated      Mobility  Bed Mobility Overal bed mobility: Needs Assistance Bed Mobility: Supine to Sit     Supine to sit: Mod assist;Max assist     General bed mobility comments: Pt shows some effort with getting to EOB, but uis very limited and needs considerable assist  Transfers Overall transfer level: Needs assistance Equipment used: Rolling walker (2 wheeled) Transfers: Sit to/from Stand Sit to Stand: Mod assist;Max assist         General transfer comment: Pt needing assist to initiate movement, but actually did show some push to get to standing  Ambulation/Gait Ambulation/Gait assistance: Max assist Ambulation Distance (Feet): 3 Feet Assistive device: Rolling walker (2 wheeled)       General Gait Details: Pt with very choppy/shuffling gait (baseline Parkinson's).  Pt with posterior lean and needing considerable verbal and tactile cues for safety  Stairs            Wheelchair Mobility    Modified Rankin (Stroke Patients Only)       Balance Overall balance assessment: Needs assistance   Sitting balance-Leahy Scale: Fair Sitting balance -  Comments: Pt able to maintain seated at      Standing balance-Leahy Scale: Poor Standing balance comment: Pt leaning backward, needs cues to use walker appropriately, overall shows poor awareness                             Pertinent Vitals/Pain Pain Assessment: 0-10 Pain Score: 3  Pain Location: R hip    Home Living Family/patient expects to be discharged to:: Skilled nursing facility Living Arrangements: Children               Additional Comments: Pt has cane and walker    Prior Function Level of Independence: Independent with assistive device(s)         Comments: Apparently pt was able to ambulate with cane w/o issue, getting in/out of the house and generally did fairly well     Hand Dominance        Extremity/Trunk Assessment   Upper Extremity Assessment: Generalized weakness (pt able to raise b/l UEs overhead, grossly functional)           Lower Extremity Assessment: Generalized weakness (AAROM only on R, L LE grossly 3/5 )         Communication   Communication: Prefers language other than English  Cognition Arousal/Alertness: Lethargic Behavior During Therapy: WFL for tasks assessed/performed Overall Cognitive Status: Impaired/Different from baseline (pt sleeping most of the morning (late sx last night))  General Comments      Exercises Total Joint Exercises Ankle Circles/Pumps: PROM;10 reps Quad Sets: AROM;10 reps Gluteal Sets: AROM;10 reps Heel Slides: AAROM;10 reps Hip ABduction/ADduction: AAROM;10 reps   Assessment/Plan    PT Assessment Patient needs continued PT services  PT Problem List Pain;Decreased strength;Decreased range of motion;Decreased activity tolerance;Decreased balance;Decreased mobility;Decreased cognition;Decreased knowledge of use of DME;Decreased safety awareness;Decreased knowledge of precautions          PT Treatment Interventions Gait training;DME instruction;Functional  mobility training;Therapeutic activities;Therapeutic exercise;Balance training;Patient/family education;Neuromuscular re-education    PT Goals (Current goals can be found in the Care Plan section)  Acute Rehab PT Goals Patient Stated Goal: go back home PT Goal Formulation: With patient/family Time For Goal Achievement: 10/18/15 Potential to Achieve Goals: Fair    Frequency BID   Barriers to discharge        Co-evaluation               End of Session Equipment Utilized During Treatment: Gait belt Activity Tolerance: Patient limited by lethargy;Patient limited by fatigue Patient left: with family/visitor present;with chair alarm set;with call bell/phone within reach Nurse Communication: Mobility status         Time: [id number] PT Time Calculation (min) (ACUTE ONLY): 31 min   Charges:   PT Evaluation $PT Eval Low Complexity: 1 Procedure PT Treatments $Therapeutic Exercise: 8-22 mins   PT G Codes:        Malachi ProGalen R Hasset Chaviano, DPT 10/04/2015, 11:53 AM

## 2015-10-05 LAB — CBC
HEMATOCRIT: 23.9 % — AB (ref 40.0–52.0)
HEMOGLOBIN: 8.1 g/dL — AB (ref 13.0–18.0)
MCH: 29.9 pg (ref 26.0–34.0)
MCHC: 33.9 g/dL (ref 32.0–36.0)
MCV: 88.3 fL (ref 80.0–100.0)
Platelets: 179 10*3/uL (ref 150–440)
RBC: 2.71 MIL/uL — AB (ref 4.40–5.90)
RDW: 14.9 % — ABNORMAL HIGH (ref 11.5–14.5)
WBC: 5.5 10*3/uL (ref 3.8–10.6)

## 2015-10-05 LAB — BASIC METABOLIC PANEL
ANION GAP: 5 (ref 5–15)
Anion gap: 4 — ABNORMAL LOW (ref 5–15)
BUN: 15 mg/dL (ref 6–20)
BUN: 14 mg/dL (ref 6–20)
CALCIUM: 7.7 mg/dL — AB (ref 8.9–10.3)
CHLORIDE: 114 mmol/L — AB (ref 101–111)
CO2: 22 mmol/L (ref 22–32)
CO2: 22 mmol/L (ref 22–32)
CREATININE: 0.79 mg/dL (ref 0.61–1.24)
Calcium: 7.4 mg/dL — ABNORMAL LOW (ref 8.9–10.3)
Chloride: 113 mmol/L — ABNORMAL HIGH (ref 101–111)
Creatinine, Ser: 0.9 mg/dL (ref 0.61–1.24)
GFR calc Af Amer: >60 mL/min (ref >60)
GFR calc Af Amer: >60 mL/min (ref >60)
GFR calc non Af Amer: >60 mL/min (ref >60)
GLUCOSE: 124 mg/dL — AB (ref 65–99)
Glucose, Bld: 126 mg/dL — ABNORMAL HIGH (ref 65–99)
POTASSIUM: 3.3 mmol/L — AB (ref 3.5–5.1)
POTASSIUM: 3.3 mmol/L — AB (ref 3.5–5.1)
SODIUM: 140 mmol/L (ref 135–145)
Sodium: 140 mmol/L (ref 135–145)

## 2015-10-05 MED ORDER — POTASSIUM CHLORIDE CRYS ER 20 MEQ PO TBCR
20.0000 meq | EXTENDED_RELEASE_TABLET | Freq: Three times a day (TID) | ORAL | Status: AC
  Administered 2015-10-05: 20 meq via ORAL
  Filled 2015-10-05: qty 1

## 2015-10-05 MED ORDER — TRAMADOL HCL 50 MG PO TABS: 50.0000 mg | ORAL_TABLET | ORAL | Status: DC | PRN

## 2015-10-05 MED ORDER — OXYCODONE HCL 5 MG PO TABS
5.0000 mg | ORAL_TABLET | ORAL | Status: DC | PRN
  Administered 2015-10-05 – 2015-10-07 (×6): 5 mg via ORAL
  Filled 2015-10-05 (×6): qty 1

## 2015-10-05 NOTE — Progress Notes (Signed)
[facility] - [place] [facility]   PATIENT NAME: Derek Hendricks    MR#:  [id number]  DATE OF BIRTH:  06/02/1940  SUBJECTIVE:  I speak the same language so did not use an interpreter POD#2 Right femoral neck fracture Denies any complaints. dter in the room  REVIEW OF SYSTEMS:   Review of Systems  Constitutional: Negative for chills, fever and weight loss.  HENT: Negative for ear discharge, ear pain and nosebleeds.   Eyes: Negative for blurred vision, pain and discharge.  Respiratory: Negative for sputum production, shortness of breath, wheezing and stridor.   Cardiovascular: Negative for chest pain, palpitations, orthopnea and PND.  Gastrointestinal: Negative for abdominal pain, diarrhea, nausea and vomiting.  Genitourinary: Negative for frequency and urgency.  Musculoskeletal: Positive for joint pain. Negative for back pain.  Neurological: Positive for tremors and weakness. Negative for sensory change, speech change and focal weakness.  Psychiatric/Behavioral: Negative for depression and hallucinations. The patient is not nervous/anxious.    Tolerating Diet:yes Tolerating PT: SNF  DRUG ALLERGIES:  No Known Allergies  VITALS:  Blood pressure 137/66, pulse 80, temperature 97.9 F (36.6 C), temperature source Oral, resp. rate 16, height 5\' 6"  (1.676 m), weight 59 kg (130 lb), SpO2 98 %.  PHYSICAL EXAMINATION:   Physical Exam  GENERAL:  75 y.o.-year-old patient lying in the bed with no acute distress. THin EYES: Pupils equal, round, reactive to light and accommodation. No scleral icterus. Extraocular muscles intact.  HEENT: Head atraumatic, normocephalic. Oropharynx and nasopharynx clear.  NECK:  Supple, no jugular venous distention. No thyroid enlargement, no tenderness.  LUNGS: Normal breath sounds bilaterally, no wheezing, rales, rhonchi. No use of accessory muscles of respiration.  CARDIOVASCULAR: S1, S2 normal. No murmurs, rubs, or gallops.   ABDOMEN: Soft, nontender, nondistended. Bowel sounds present. No organomegaly or mass.  EXTREMITIES: No cyanosis, clubbing or edema b/l. Right hip surgical dressing+    NEUROLOGIC: Cranial nerves II through XII are intact. No focal Motor or sensory deficits b/l.  Tremors+ PSYCHIATRIC:  patient is alert and oriented x 3.  SKIN: No obvious rash, lesion, or ulcer.   LABORATORY PANEL:  CBC  Recent Labs Lab 10/05/15 0338  WBC 5.5  HGB 8.1*  HCT 23.9*  PLT 179    Chemistries   Recent Labs Lab 10/02/15 2022  10/05/15 0751  NA 139  < > 140  K 3.7  < > 3.3*  CL 107  < > 113*  CO2 27  < > 22  GLUCOSE 145*  < > 124*  BUN 47*  < > 14  CREATININE 0.97  < > 0.90  CALCIUM 9.1  < > 7.7*  MG 2.3  --   --   < > = values in this interval not displayed. Cardiac Enzymes No results for input(s): TROPONINI in the last 168 hours. RADIOLOGY:  Dg Hip Port Unilat With Pelvis 1v Right  Result Date: 10/04/2015 CLINICAL DATA:  Status post right hip arthroplasty. Initial encounter. EXAM: DG HIP (WITH OR WITHOUT PELVIS) 1V PORT RIGHT COMPARISON:  Right hip radiographs performed 10/02/2015 FINDINGS: The patient is status post right hip arthroplasty. The arthroplasty appears grossly intact, without evidence of loosening. Overlying postoperative air and skin staples are seen, with associated drains. The left hip arthroplasty is grossly unremarkable in appearance, with associated cerclage wires. Diffuse vascular calcifications are noted. The visualized bowel gas pattern is grossly unremarkable. IMPRESSION: 1. Status post right hip arthroplasty. No evidence of fracture or loosening. 2. Diffuse  vascular calcifications seen. Electronically Signed   By: Roanna RaiderJeffery  Chang M.D.   On: 10/04/2015 02:30   ASSESSMENT AND PLAN:   75 y.o.malewith a history of Alzheimer's dementia and Parkinson'snow being admitted with: 1. Right femoral neck fracture POD# 2 Patient currently has no cardiopulmonary symptoms, no  cardiac history PT recommends rehab -prn pain meds  2. Hyponatremia, mild - received IVf. Eating ok  3. History of Alzheimer's dementia-continue Sinemet and Aricept.  4. Post op mild anemia -transfuse as needed  To rehab on Monday once insurance auth approved Case discussed with Care Management/Social Worker. Management plans discussed with the patient, family and they are in agreement.  CODE STATUS:Full  DVT Prophylaxis:lovenox  TOTAL TIME TAKING CARE OF THIS PATIENT: 30 minutes.  >50% time spent on counselling and coordination of care with pt and Dter  POSSIBLE D/C IN 1-2 DAYS, DEPENDING ON CLINICAL CONDITION.  Note: This dictation was prepared with Dragon dictation along with smaller phrase technology. Any transcriptional errors that result from this process are unintentional.  Buskey,Mekhi Sonn M.D on 10/05/2015 at 9:34 AM  Between 7am to 6pm - Pager - [phone]  After 6pm go to [email or web address] - password [email or web address] [facility]  [facility]  Office  [phone]  CC: Primary care physician; No primary care provider on file.

## 2015-10-05 NOTE — Progress Notes (Signed)
   Subjective: 2 Days Post-Op Procedure(s) (LRB): ARTHROPLASTY BIPOLAR HIP (HEMIARTHROPLASTY) (Right) Patient reports pain as mild.   Patient is well, and has had no acute complaints or problems Continue with  therapy today.  Plan is to go Rehab after hospital stay. no nausea and no vomiting Patient denies any chest pains or shortness of breath. Resting well.  Objective: Vital signs in last 24 hours: Temp:  [98.4 F (36.9 C)-98.8 F (37.1 C)] 98.4 F (36.9 C) (09/30 0401) Pulse Rate:  [68-73] 73 (09/30 0401) Resp:  [16-18] 18 (09/30 0401) BP: (94-115)/(45-55) 110/51 (09/30 0401) SpO2:  [96 %-100 %] 97 % (09/30 0401) well approximated incision Heels are non tender and elevated off the bed using rolled towels Intake/Output from previous day: 09/29 0701 - 09/30 0700 In: 3840 [P.O.:120; I.V.:2620; IV Piggyback:1100] Out: 950 [Urine:750; Drains:200] Intake/Output this shift: No intake/output data recorded.   Recent Labs  10/02/15 2022 10/03/15 0515 10/04/15 0350 10/05/15 0338  HGB 12.0* 10.8* 10.0* 8.1*    Recent Labs  10/04/15 0350 10/05/15 0338  WBC 10.5 5.5  RBC 3.34* 2.71*  HCT 29.9* 23.9*  PLT 204 179    Recent Labs  10/04/15 0350 10/05/15 0338  NA 138 140  K 3.6 3.3*  CL 110 114*  CO2 24 22  BUN 24* 15  CREATININE 0.73 0.79  GLUCOSE 104* 126*  CALCIUM 8.0* 7.4*    Recent Labs  10/03/15 0515  INR 1.04    EXAM General - Patient is Alert, Appropriate and Oriented Extremity - Neurologically intact Neurovascular intact Sensation intact distally Intact pulses distally Dorsiflexion/Plantar flexion intact Compartment soft Dressing - dressing C/D/I Motor Function - intact, moving foot and toes well on exam.    Past Medical History:  Diagnosis Date  . Alzheimer disease   . Parkinson disease ([place])     Assessment/Plan: 2 Days Post-Op Procedure(s) (LRB): ARTHROPLASTY BIPOLAR HIP (HEMIARTHROPLASTY) (Right) Active Problems:   Hip fracture  (HCC)  Estimated body mass index is 20.98 kg/m as calculated from the following:   Height as of this encounter: _0  (1.676 m).   Weight as of this encounter: 59 kg (130 lb). Up with therapy Discharge to SNF  Labs: reviewed; K+ 3.3 hgb 8.1 down from 10.1 DVT Prophylaxis - Lovenox, Foot Pumps and TED hose Weight-Bearing as tolerated to right leg Pt needs a bowel movement klor con order x 3 Met b and hgb in am Hemovac discontinued F/u in office in 2 weeks Continue lovenox 30 mg bid for 14 at discharge Change dressing as needed  Wille Glaser R. [place] 10/05/2015, 7:26 AM

## 2015-10-05 NOTE — Progress Notes (Signed)
Physical Therapy Treatment Patient Details Name: Derek Hendricks MRN: [id number] DOB: May 03, 1940 Today's Date: 10/05/2015    History of Present Illness Pt. is a 75 y.o. male who was admitted to [facility] after falling and sustaining a right hip fx s/p hemiarthroplasty repair.    PT Comments    Pt in chair, ready to go back to bed.  Stood and was able to ambulate 15' with walker and short shuffling steps.  Unsteady during turn and seemed fatigued when he got back to the bed.  Sat then was able to transfer to bedside commode with min assist to urinate only.  Assisted back to bed where he needed max a to return to supine and reposition to comfort.     Follow Up Recommendations  SNF     Equipment Recommendations       Recommendations for Other Services       Precautions / Restrictions Precautions Precautions: Fall Restrictions Weight Bearing Restrictions: Yes RLE Weight Bearing: Weight bearing as tolerated    Mobility  Bed Mobility Overal bed mobility: Needs Assistance Bed Mobility: Sit to Supine     Supine to sit: Max assist        Transfers Overall transfer level: Needs assistance Equipment used: Rolling walker (2 wheeled) Transfers: Sit to/from Stand Sit to Stand: Min assist         General transfer comment: min a from bed due to increased height, mod a from recliner  Ambulation/Gait Ambulation/Gait assistance: Min assist Ambulation Distance (Feet): 15 Feet (with turn) Assistive device: Rolling walker (2 wheeled) Gait Pattern/deviations: Decreased step length - right;Decreased step length - left;Shuffle;Narrow base of support   Gait velocity interpretation: <1.8 ft/sec, indicative of risk for recurrent falls General Gait Details: choppy shuffling steps but does not report increased pain with weight bearing.   Stairs            Wheelchair Mobility    Modified Rankin (Stroke Patients Only)       Balance Overall balance assessment: Needs  assistance Sitting-balance support: Feet supported Sitting balance-Leahy Scale: Fair Sitting balance - Comments: sitting edge of bed with supervision   Standing balance support: Bilateral upper extremity supported Standing balance-Leahy Scale: Poor                      Cognition Arousal/Alertness: Awake/alert Behavior During Therapy: WFL for tasks assessed/performed Overall Cognitive Status: History of cognitive impairments - at baseline                      Exercises Total Joint Exercises Ankle Circles/Pumps: PROM;10 reps Quad Sets: AROM;10 reps Heel Slides: AAROM;10 reps Hip ABduction/ADduction: AAROM;10 reps General Exercises - Lower Extremity Short Arc QuadBarbaraann Boys;Right;10 reps;Supine Long Arc Quad: AROM;Right;10 reps;Seated Hip ABduction/ADduction: AROM;Right;10 reps;Standing Straight Leg Raises: AROM;Right;10 reps;Standing Hip Flexion/Marching: AROM;Right;10 reps;Standing Other Exercises Other Exercises: to commode to urinate but no BM    General Comments        Pertinent Vitals/Pain Pain Assessment: 0-10 Pain Score: 3  Pain Location: R hip Pain Descriptors / Indicators: Sore    Home Living                      Prior Function            PT Goals (current goals can now be found in the care plan section) Progress towards PT goals: Progressing toward goals    Frequency    BID  PT Plan Current plan remains appropriate    Co-evaluation             End of Session Equipment Utilized During Treatment: Gait belt Activity Tolerance: Patient limited by fatigue Patient left: in bed;with call bell/phone within reach;with bed alarm set;with family/visitor present     Time: [id number] PT Time Calculation (min) (ACUTE ONLY): 24 min  Charges:  $Gait Training: 8-22 mins $Therapeutic Exercise: 8-22 mins                    G Codes:      Danielle DessSarah Tedford Berg 10/05/2015, 11:57 AM

## 2015-10-05 NOTE — Progress Notes (Signed)
Patient is alert x 3. Understands some English. Family at bedside. Incont of urine and had a small BM this shift. Up with [facility] x[facility]. Up to chair for part of this shift, tolerated well. Hemeovac removed today and SL IV. Gave oral pain med x1, with good relief. POD #2. Good diet. Dressing is CD&I. Potassium 3.3 this shift, gave oral supplement.

## 2015-10-05 NOTE — Progress Notes (Signed)
Physical Therapy Treatment Patient Details Name: Derek Hendricks MRN: [id number] DOB: 09-05-1940 Today's Date: 10/05/2015    History of Present Illness Pt. is a 75 y.o. male who was admitted to [facility] after falling and sustaining a right hip fx s/p hemiarthroplasty repair.    PT Comments    Pt in bed ready for session.  Participated in exercises as described below.  Pt was able to get to edge of bed with max a x 1.  He stood and transferred to chair at bedside with min a x 1.  After short rest and exercises, he was able to stand with mod a x 1 and ambulate 10' with rolling walker and min a x 1 to maintain balance.  Pt takes short choppy steps with decreased step height and length but does not seem to have increased pain with gait.  Fatigues quickly.   Follow Up Recommendations  SNF     Equipment Recommendations       Recommendations for Other Services       Precautions / Restrictions Precautions Precautions: Fall Restrictions Weight Bearing Restrictions: Yes RLE Weight Bearing: Weight bearing as tolerated    Mobility  Bed Mobility Overal bed mobility: Needs Assistance Bed Mobility: Supine to Sit     Supine to sit: Max assist        Transfers Overall transfer level: Needs assistance Equipment used: Rolling walker (2 wheeled) Transfers: Sit to/from Stand Sit to Stand: Min assist;Mod assist         General transfer comment: min a from bed due to increased height, mod a from recliner  Ambulation/Gait Ambulation/Gait assistance: Min assist Ambulation Distance (Feet): 10 Feet Assistive device: Rolling walker (2 wheeled) Gait Pattern/deviations: Narrow base of support;Shuffle   Gait velocity interpretation: <1.8 ft/sec, indicative of risk for recurrent falls General Gait Details: choppy cshuffling steps but does not report increased pain with weight bearing.   Stairs            Wheelchair Mobility    Modified Rankin (Stroke Patients Only)        Balance Overall balance assessment: Needs assistance Sitting-balance support: Feet supported;Single extremity supported Sitting balance-Leahy Scale: Fair Sitting balance - Comments: sitting edge of bed with supervision   Standing balance support: Bilateral upper extremity supported Standing balance-Leahy Scale: Poor                      Cognition Arousal/Alertness: Awake/alert Behavior During Therapy: WFL for tasks assessed/performed Overall Cognitive Status: History of cognitive impairments - at baseline                      Exercises Total Joint Exercises Ankle Circles/Pumps: PROM;10 reps Quad Sets: AROM;10 reps Heel Slides: AAROM;10 reps Hip ABduction/ADduction: AAROM;10 reps General Exercises - Lower Extremity Short Arc QuadBarbaraann Boys: AAROM;Right;10 reps;Supine Long Arc Quad: AROM;Right;10 reps;Seated Hip ABduction/ADduction: AROM;Right;10 reps;Standing Straight Leg Raises: AROM;Right;10 reps;Standing Hip Flexion/Marching: AROM;Right;10 reps;Standing    General Comments        Pertinent Vitals/Pain Pain Assessment: 0-10 Pain Score: 3  Pain Location: R hip Pain Descriptors / Indicators: Aching;Operative site guarding    Home Living                      Prior Function            PT Goals (current goals can now be found in the care plan section) Progress towards PT goals: Progressing toward goals  Frequency    BID      PT Plan Current plan remains appropriate    Co-evaluation             End of Session Equipment Utilized During Treatment: Gait belt Activity Tolerance: Patient limited by fatigue Patient left: in chair;with call bell/phone within reach;with chair alarm set;with family/visitor present     Time: 1610-9604 PT Time Calculation (min) (ACUTE ONLY): 24 min  Charges:  $Gait Training: 8-22 mins $Therapeutic Exercise: 8-22 mins                    G Codes:      Danielle Dess, PTA 10/05/15, 9:14 AM

## 2015-10-06 LAB — CBC WITH DIFFERENTIAL/PLATELET
BASOS ABS: 0.1 10*3/uL (ref 0–0.1)
Basophils Relative: 1 %
Eosinophils Absolute: 0.1 10*3/uL (ref 0–0.7)
Eosinophils Relative: 1 %
HCT: 24.6 % — ABNORMAL LOW (ref 40.0–52.0)
HEMOGLOBIN: 8.6 g/dL — AB (ref 13.0–18.0)
LYMPHS ABS: 0.8 10*3/uL — AB (ref 1.0–3.6)
LYMPHS PCT: 10 %
MCH: 30.4 pg (ref 26.0–34.0)
MCHC: 34.7 g/dL (ref 32.0–36.0)
MCV: 87.6 fL (ref 80.0–100.0)
Monocytes Absolute: 0.8 10*3/uL (ref 0.2–1.0)
Monocytes Relative: 11 %
NEUTROS ABS: 6.1 10*3/uL (ref 1.4–6.5)
NEUTROS PCT: 77 %
Platelets: 214 10*3/uL (ref 150–440)
RBC: 2.81 MIL/uL — AB (ref 4.40–5.90)
RDW: 14.9 % — AB (ref 11.5–14.5)
WBC: 7.9 10*3/uL (ref 3.8–10.6)

## 2015-10-06 NOTE — Progress Notes (Signed)
[facility] [place] [facility]   PATIENT NAME: Derek GistRamesh Hendricks    MR#:  [id number]  DATE OF BIRTH:  1940-05-24  SUBJECTIVE:  I speak the same language so did not use an interpreter POD#2 3 Right femoral neck fracture Denies any complaints. dter in the room  REVIEW OF SYSTEMS:   Review of Systems  Constitutional: Negative for chills, fever and weight loss.  HENT: Negative for ear discharge, ear pain and nosebleeds.   Eyes: Negative for blurred vision, pain and discharge.  Respiratory: Negative for sputum production, shortness of breath, wheezing and stridor.   Cardiovascular: Negative for chest pain, palpitations, orthopnea and PND.  Gastrointestinal: Negative for abdominal pain, diarrhea, nausea and vomiting.  Genitourinary: Negative for frequency and urgency.  Musculoskeletal: Positive for joint pain. Negative for back pain.  Neurological: Positive for tremors and weakness. Negative for sensory change, speech change and focal weakness.  Psychiatric/Behavioral: Negative for depression and hallucinations. The patient is not nervous/anxious.    Tolerating Diet:yes Tolerating PT: SNF  DRUG ALLERGIES:  No Known Allergies  VITALS:  Blood pressure (!) 137/52, pulse 76, temperature 98.3 F (36.8 C), temperature source Oral, resp. rate 16, height 5\' 6"  (1.676 m), weight 59 kg (130 lb), SpO2 98 %.  PHYSICAL EXAMINATION:   Physical Exam  GENERAL:  75 y.o.-year-old patient lying in the bed with no acute distress. THin EYES: Pupils equal, round, reactive to light and accommodation. No scleral icterus. Extraocular muscles intact.  HEENT: Head atraumatic, normocephalic. Oropharynx and nasopharynx clear.  NECK:  Supple, no jugular venous distention. No thyroid enlargement, no tenderness.  LUNGS: Normal breath sounds bilaterally, no wheezing, rales, rhonchi. No use of accessory muscles of respiration.  CARDIOVASCULAR: S1, S2 normal. No murmurs, rubs, or gallops.   ABDOMEN: Soft, nontender, nondistended. Bowel sounds present. No organomegaly or mass.  EXTREMITIES: No cyanosis, clubbing or edema b/l. Right hip surgical dressing+    NEUROLOGIC: Cranial nerves II through XII are intact. No focal Motor or sensory deficits b/l.  Tremors+ PSYCHIATRIC:  patient is alert and oriented x 3.  SKIN: No obvious rash, lesion, or ulcer.   LABORATORY PANEL:  CBC  Recent Labs Lab 10/06/15 0429  WBC 7.9  HGB 8.6*  HCT 24.6*  PLT 214    Chemistries   Recent Labs Lab 10/02/15 2022  10/05/15 0751  NA 139  < > 140  K 3.7  < > 3.3*  CL 107  < > 113*  CO2 27  < > 22  GLUCOSE 145*  < > 124*  BUN 47*  < > 14  CREATININE 0.97  < > 0.90  CALCIUM 9.1  < > 7.7*  MG 2.3  --   --   < > = values in this interval not displayed. Cardiac Enzymes No results for input(s): TROPONINI in the last 168 hours. RADIOLOGY:  No results found. ASSESSMENT AND PLAN:   75 y.o.malewith a history of Alzheimer's dementia and Parkinson'snow being admitted with: 1. Right femoral neck fracture POD# 3 Patient currently has no cardiopulmonary symptoms, no cardiac history PT recommends rehab -prn pain meds  2. Hyponatremia, mild - received IVf. Eating ok  3. History of Alzheimer's dementia-continue Sinemet and Aricept.  4. Post op mild anemia -transfuse as needed  To rehab on Monday once insurance auth approved Case discussed with Care Management/Social Worker. Management plans discussed with the patient, family and they are in agreement.  CODE STATUS:Full  DVT Prophylaxis:lovenox  TOTAL TIME TAKING CARE  OF THIS PATIENT: 30 minutes.  >50% time spent on counselling and coordination of care with pt and son  POSSIBLE D/C IN 1 DAYS, DEPENDING ON CLINICAL CONDITION.  Note: This dictation was prepared with Dragon dictation along with smaller phrase technology. Any transcriptional errors that result from this process are unintentional.  Sealy,Anae Hams M.D on 10/06/2015 at  3:20 PM  Between 7am to 6pm - Pager - [phone]  After 6pm go to [email or web address] - password [email or web address] [facility]  [facility]  Office  [phone]  CC: Primary care physician; No primary care provider on file.

## 2015-10-06 NOTE — Progress Notes (Signed)
Physical Therapy Treatment Patient Details Name: LETRELL ATTWOOD MRN: [id number] DOB: March 08, 1940 Today's Date: 10/06/2015    History of Present Illness Pt. is a 75 y.o. male who was admitted to [facility] after falling and sustaining a right hip fx s/p hemiarthroplasty repair.    PT Comments    Pt shows good effort with PT despite being tired and weak.  He was able to increase the amount of effort he could give during bed exercises.  He was able to increase distance with ambulation but without constant cuing for step length/even cadence he shows step-to Parkinsonian shuffle and is heavily reliant on the walker. Pt making slow but steady gains.   Follow Up Recommendations  SNF     Equipment Recommendations       Recommendations for Other Services       Precautions / Restrictions Precautions Precautions: Fall Restrictions RLE Weight Bearing: Weight bearing as tolerated    Mobility  Bed Mobility Overal bed mobility: Needs Assistance Bed Mobility: Supine to Sit;Sit to Supine     Supine to sit: Min assist;Mod assist Sit to supine: Mod assist   General bed mobility comments: Pt again shows some effort in getting to EOB, but ultimately needs assist with UEs to pull up.  Transfers Overall transfer level: Needs assistance Equipment used: Rolling walker (2 wheeled) Transfers: Sit to/from Stand Sit to Stand: Min assist         General transfer comment: Pt shows good effort in getting to standing at EOB and ultimately did well with maintaining balance and appropriate AD use  Ambulation/Gait Ambulation/Gait assistance: Min guard Ambulation Distance (Feet): 35 Feet Assistive device: Rolling walker (2 wheeled)       General Gait Details: Pt continues to have typical Parkinson shuffle (generally with L foot trailing), but with cuing he was able to (inconsistently) increase gait pattern and advance each foot past the other.   Stairs            Wheelchair Mobility     Modified Rankin (Stroke Patients Only)       Balance                                    Cognition Arousal/Alertness: Awake/alert Behavior During Therapy: WFL for tasks assessed/performed Overall Cognitive Status: History of cognitive impairments - at baseline                      Exercises Total Joint Exercises Ankle Circles/Pumps: 10 reps;AAROM Quad Sets: 10 reps;Strengthening Gluteal Sets: Strengthening;10 reps Heel Slides: AAROM;AROM;10 reps Hip ABduction/ADduction: AAROM;AROM;10 reps    General Comments        Pertinent Vitals/Pain Pain Score: 2     Home Living                      Prior Function            PT Goals (current goals can now be found in the care plan section) Progress towards PT goals: Progressing toward goals    Frequency    BID      PT Plan Current plan remains appropriate    Co-evaluation             End of Session Equipment Utilized During Treatment: Gait belt Activity Tolerance: Patient limited by fatigue Patient left: in bed;with call bell/phone within reach;with bed alarm set;with family/visitor present  Time: [id number] PT Time Calculation (min) (ACUTE ONLY): 31 min  Charges:  $Gait Training: 8-22 mins $Therapeutic Exercise: 8-22 mins                    G Codes:      Malachi ProGalen R Shawntel Farnworth, DPT 10/06/2015, 1:22 PM

## 2015-10-06 NOTE — Progress Notes (Signed)
   Subjective: 3 Days Post-Op Procedure(s) (LRB): ARTHROPLASTY BIPOLAR HIP (HEMIARTHROPLASTY) (Right) Patient reports pain as mild.   Patient is well, and has had no acute complaints or problems Continue with  therapy today.  Plan is to go Rehab after hospital stay. no nausea and no vomiting Patient denies any chest pains or shortness of breath. Objective: Vital signs in last 24 hours: Temp:  [97.6 F (36.4 C)-98.5 F (36.9 C)] 98.5 F (36.9 C) (10/01 0300) Pulse Rate:  [79-89] 82 (10/01 0300) Resp:  [16-18] 18 (10/01 0300) BP: (122-154)/(58-80) 154/80 (10/01 0300) SpO2:  [98 %-100 %] 98 % (10/01 0300) well approximated incision Heels are non tender and elevated off the bed using rolled towels Intake/Output from previous day: 09/30 0701 - 10/01 0700 In: 750 [P.O.:750] Out: -  Intake/Output this shift: No intake/output data recorded.   Recent Labs  10/04/15 0350 10/05/15 0338 10/06/15 0429  HGB 10.0* 8.1* 8.6*    Recent Labs  10/05/15 0338 10/06/15 0429  WBC 5.5 7.9  RBC 2.71* 2.81*  HCT 23.9* 24.6*  PLT 179 214    Recent Labs  10/05/15 0338 10/05/15 0751  NA 140 140  K 3.3* 3.3*  CL 114* 113*  CO2 22 22  BUN 15 14  CREATININE 0.79 0.90  GLUCOSE 126* 124*  CALCIUM 7.4* 7.7*   No results for input(s): LABPT, INR in the last 72 hours.  EXAM General - Patient is Alert, Appropriate and Oriented Extremity - Neurologically intact Neurovascular intact Sensation intact distally Intact pulses distally Dorsiflexion/Plantar flexion intact Compartment soft Dressing - scant drainage Motor Function - intact, moving foot and toes well on exam.    Past Medical History:  Diagnosis Date  . Alzheimer disease   . Parkinson disease ([place])     Assessment/Plan: 3 Days Post-Op Procedure(s) (LRB): ARTHROPLASTY BIPOLAR HIP (HEMIARTHROPLASTY) (Right) Active Problems:   Hip fracture (HCC)  Estimated body mass index is 20.98 kg/m as calculated from the  following:   Height as of this encounter: '5\' 6"'$  (1.676 m).   Weight as of this encounter: 59 kg (130 lb). Up with therapy Plan for discharge tomorrow Discharge to SNF  Labs: reviewed. Met b pending hgb up to 8.6 DVT Prophylaxis - Lovenox, Foot Pumps and TED hose Weight-Bearing as tolerated to right leg F/u in office in 2 weeks Continue lovenox 30 mg bid for 14 at discharge Change dressing as needed     Wille Glaser R. [place] 10/06/2015, 7:36 AM

## 2015-10-06 NOTE — Care Management Important Message (Signed)
Important Message  Patient Details  Name: Derek Hendricks MRN: [id number] Date of Birth: 1940/06/01   Medicare Important Message Given:  Yes    Jalina Blowers A, RN 10/06/2015, 3:44 PM

## 2015-10-06 NOTE — Progress Notes (Signed)
Patient is A&O x3. Up to chair with a assist x1 and [employer]. Incont of urine. LBM x2 this shift. Gave oral meds for pain x2. Family at bedside. Dressing is intact to right hip.

## 2015-10-07 MED ORDER — SENNOSIDES-DOCUSATE SODIUM 8.6-50 MG PO TABS: 1.0000 | ORAL_TABLET | Freq: Two times a day (BID) | ORAL | 0 refills | Status: AC

## 2015-10-07 MED ORDER — BISACODYL 10 MG RE SUPP: 10.0000 mg | Freq: Every day | RECTAL | 0 refills | Status: AC | PRN

## 2015-10-07 MED ORDER — TRAMADOL HCL 50 MG PO TABS: 50.0000 mg | ORAL_TABLET | Freq: Four times a day (QID) | ORAL | 0 refills | Status: DC | PRN

## 2015-10-07 MED ORDER — ENOXAPARIN SODIUM 40 MG/0.4ML ~~LOC~~ SOLN: 40.0000 mg | SUBCUTANEOUS | 0 refills | Status: AC

## 2015-10-07 MED ORDER — FERROUS SULFATE 325 (65 FE) MG PO TABS: 325.0000 mg | ORAL_TABLET | Freq: Two times a day (BID) | ORAL | 3 refills | Status: AC

## 2015-10-07 NOTE — Clinical Social Work Placement (Signed)
   CLINICAL SOCIAL WORK PLACEMENT  NOTE  Date:  10/07/2015  Patient Details  Name: Jobie QuakerRamesh S [place]icle MRN: [id number] Date of Birth: May 15, 1940  Clinical Social Work is seeking post-discharge placement for this patient at the Skilled  Nursing Facility level of care (*CSW will initial, date and re-position this form in  chart as items are completed):  Yes   Patient/family provided with [facility] Clinical Social Work Department's list of facilities offering this level of care within the geographic area requested by the patient (or if unable, by the patient's family).  Yes   Patient/family informed of their freedom to choose among providers that offer the needed level of care, that participate in Medicare, Medicaid or managed care program needed by the patient, have an available bed and are willing to accept the patient.  Yes   Patient/family informed of [facility]'s ownership interest in [facility] and [facility], as well as of the fact that they are under no obligation to receive care at these facilities.  PASRR submitted to EDS on 10/03/15     PASRR number received on 10/03/15     Existing PASRR number confirmed on       FL2 transmitted to all facilities in geographic area requested by pt/family on 10/03/15     FL2 transmitted to all facilities within larger geographic area on       Patient informed that his/her managed care company has contracts with or will negotiate with certain facilities, including the following:        Yes   Patient/family informed of bed offers received.  Patient chooses bed at  [facility] )     Physician recommends and patient chooses bed at      Patient to be transferred to  [facility] ) on 10/07/15.  Patient to be transferred to facility by  [facility] )     Patient family notified on 10/07/15 of transfer.  Name of family member notified:   (Patient's daughter Asher MuirJamie is aware of D/C today.  )     PHYSICIAN        Additional Comment:    _______________________________________________ Starletta Houchin, Darleen CrockerBailey M, LCSW 10/07/2015, 11:33 AM

## 2015-10-07 NOTE — Discharge Summary (Signed)
[facility] - [place] at [facility]   PATIENT NAME: Derek Hendricks    MR#:  [id number]  DATE OF BIRTH:  November 05, 1940  DATE OF ADMISSION:  10/02/2015 ADMITTING PHYSICIAN: Tonye Royalty, DO  DATE OF DISCHARGE: 10/07/15  PRIMARY CARE PHYSICIAN: No primary care provider on file.    ADMISSION DIAGNOSIS:  Closed fracture of neck of right femur, initial encounter (HCC) [S72.001A]  DISCHARGE DIAGNOSIS:  Right hip fracture s/p Right hip Hemiarthroplasty  SECONDARY DIAGNOSIS:   Past Medical History:  Diagnosis Date  . Alzheimer disease   . Parkinson disease [facility])     HOSPITAL COURSE:   75 y.o.malewith a history of Alzheimer's dementia and Parkinson'snow being admitted with: 1. Right femoral neck fracture POD# 4 Patient currently has no cardiopulmonary symptoms,no cardiac history PT recommends rehab -prn pain meds  2. Hyponatremia, mild - received IVf. Eating ok -resolved  3. History of Alzheimer's dementia-continue Sinemet and Aricept.  4. Post op mild anemia -transfuse as needed  Ok for d/c to rehab today CONSULTS OBTAINED:  Treatment Team:  Donato Heinz, MD Enedina Finner, MD  DRUG ALLERGIES:  No Known Allergies  DISCHARGE MEDICATIONS:   Current Discharge Medication List    START taking these medications   Details  bisacodyl (DULCOLAX) 10 MG suppository Place 1 suppository (10 mg total) rectally daily as needed for moderate constipation. Qty: 12 suppository, Refills: 0    enoxaparin (LOVENOX) 40 MG/0.4ML injection Inject 0.4 mLs (40 mg total) into the skin daily. Qty: 14 Syringe, Refills: 0    ferrous sulfate 325 (65 FE) MG tablet Take 1 tablet (325 mg total) by mouth 2 (two) times daily with a meal. Qty: 60 tablet, Refills: 3    senna-docusate (SENOKOT-S) 8.6-50 MG tablet Take 1 tablet by mouth 2 (two) times daily. Qty: 60 tablet, Refills: 0    traMADol (ULTRAM) 50 MG tablet Take 1 tablet (50 mg total) by mouth every 6  (six) hours as needed for moderate pain. Qty: 30 tablet, Refills: 0      CONTINUE these medications which have NOT CHANGED   Details  carbidopa-levodopa (SINEMET IR) 25-250 MG tablet Take 1 tablet by mouth 3 (three) times daily.    donepezil (ARICEPT) 10 MG tablet Take 10 mg by mouth at bedtime.        If you experience worsening of your admission symptoms, develop shortness of breath, life threatening emergency, suicidal or homicidal thoughts you must seek medical attention immediately by calling 911 or calling your MD immediately  if symptoms less severe.  You Must read complete instructions/literature along with all the possible adverse reactions/side effects for all the Medicines you take and that have been prescribed to you. Take any new Medicines after you have completely understood and accept all the possible adverse reactions/side effects.   Please note  You were cared for by a hospitalist during your hospital stay. If you have any questions about your discharge medications or the care you received while you were in the hospital after you are discharged, you can call the unit and asked to speak with the hospitalist on call if the hospitalist that took care of you is not available. Once you are discharged, your primary care physician will handle any further medical issues. Please note that NO REFILLS for any discharge medications will be authorized once you are discharged, as it is imperative that you return to your primary care physician (or establish a relationship with a primary care physician if you  do not have one) for your aftercare needs so that they can reassess your need for medications and monitor your lab values. Today   SUBJECTIVE   No new complaints Had BM y"day  VITAL SIGNS:  Blood pressure (!) 127/59, pulse (!) 58, temperature 97.5 F (36.4 C), temperature source Oral, resp. rate 16, height 5\' 6"  (1.676 m), weight 59 kg (130 lb), SpO2 97 %.  I/O:   Intake/Output  Summary (Last 24 hours) at 10/07/15 0701 Last data filed at 10/07/15 0436  Gross per 24 hour  Intake              480 ml  Output                0 ml  Net              480 ml    PHYSICAL EXAMINATION:  GENERAL:  75 y.o.-year-old patient lying in the bed with no acute distress.  EYES: Pupils equal, round, reactive to light and accommodation. No scleral icterus. Extraocular muscles intact.  HEENT: Head atraumatic, normocephalic. Oropharynx and nasopharynx clear.  NECK:  Supple, no jugular venous distention. No thyroid enlargement, no tenderness.  LUNGS: Normal breath sounds bilaterally, no wheezing, rales,rhonchi or crepitation. No use of accessory muscles of respiration.  CARDIOVASCULAR: S1, S2 normal. No murmurs, rubs, or gallops.  ABDOMEN: Soft, non-tender, non-distended. Bowel sounds present. No organomegaly or mass.  EXTREMITIES: No pedal edema, cyanosis, or clubbing.  NEUROLOGIC: Cranial nerves II through XII are intact. Muscle strength 5/5 in all extremities. Sensation intact. Gait not checked. Rest tremors PSYCHIATRIC: The patient is alert and oriented x 3.  SKIN: No obvious rash, lesion, or ulcer.   DATA REVIEW:   CBC   Recent Labs Lab 10/06/15 0429  WBC 7.9  HGB 8.6*  HCT 24.6*  PLT 214    Chemistries   Recent Labs Lab 10/02/15 2022  10/05/15 0751  NA 139  < > 140  K 3.7  < > 3.3*  CL 107  < > 113*  CO2 27  < > 22  GLUCOSE 145*  < > 124*  BUN 47*  < > 14  CREATININE 0.97  < > 0.90  CALCIUM 9.1  < > 7.7*  MG 2.3  --   --   < > = values in this interval not displayed.  Microbiology Results   Recent Results (from the past 240 hour(s))  Surgical PCR screen     Status: None   Collection Time: 10/03/15 10:45 AM  Result Value Ref Range Status   MRSA, PCR NEGATIVE NEGATIVE Final   Staphylococcus aureus NEGATIVE NEGATIVE Final    Comment:        The Xpert SA Assay (FDA approved for NASAL specimens in patients over 76 years of age), is one component of a  comprehensive surveillance program.  Test performance has been validated by [facility] for patients greater than or equal to 76 year old. It is not intended to diagnose infection nor to guide or monitor treatment.     RADIOLOGY:  No results found.   Management plans discussed with the patient, family and they are in agreement.  CODE STATUS:     Code Status Orders        Start     Ordered   10/02/15 2159  Full code  Continuous     10/02/15 2158    Code Status History    Date Active Date Inactive Code Status Order ID  Comments User Context   This patient has a current code status but no historical code status.      TOTAL TIME TAKING CARE OF THIS PATIENT: 40 minutes.    Delahunty,Amarra Sawyer M.D on 10/07/2015 at 7:01 AM  Between 7am to 6pm - Pager - [phone] After 6pm go to [email or web address] - password [email or web address] [facility]  [place] [facility]  Office  [phone]  CC: Primary care physician; No primary care provider on file.

## 2015-10-07 NOTE — Progress Notes (Signed)
   Subjective: 4 Days Post-Op Procedure(s) (LRB): ARTHROPLASTY BIPOLAR HIP (HEMIARTHROPLASTY) (Right) Patient reports pain as mild.   Patient is well, and has had no acute complaints or problems Continue with therapy today.  Plan is to go Rehab after hospital stay. no nausea and no vomiting Patient denies any chest pains or shortness of breath. Objective: Vital signs in last 24 hours: Temp:  [97.5 F (36.4 C)-98.3 F (36.8 C)] 97.5 F (36.4 C) (10/02 0436) Pulse Rate:  [58-79] 58 (10/02 0436) Resp:  [16-18] 16 (10/02 0436) BP: (124-137)/(52-60) 127/59 (10/02 0436) SpO2:  [97 %-100 %] 97 % (10/02 0436) well approximated incision Heels are non tender and elevated off the bed using rolled towels Intake/Output from previous day: 10/01 0701 - 10/02 0700 In: 480 [P.O.:480] Out: -  Intake/Output this shift: No intake/output data recorded.   Recent Labs  10/05/15 0338 10/06/15 0429  HGB 8.1* 8.6*    Recent Labs  10/05/15 0338 10/06/15 0429  WBC 5.5 7.9  RBC 2.71* 2.81*  HCT 23.9* 24.6*  PLT 179 214    Recent Labs  10/05/15 0338 10/05/15 0751  NA 140 140  K 3.3* 3.3*  CL 114* 113*  CO2 22 22  BUN 15 14  CREATININE 0.79 0.90  GLUCOSE 126* 124*  CALCIUM 7.4* 7.7*   No results for input(s): LABPT, INR in the last 72 hours.  EXAM General - Patient is Alert, Appropriate and Oriented Extremity - Neurologically intact Neurovascular intact Sensation intact distally Intact pulses distally Dorsiflexion/Plantar flexion intact Compartment soft Dressing - scant drainage Motor Function - intact, moving foot and toes well on exam.    Past Medical History:  Diagnosis Date  . Alzheimer disease   . Parkinson disease (HCC)     Assessment/Plan: 4 Days Post-Op Procedure(s) (LRB): ARTHROPLASTY BIPOLAR HIP (HEMIARTHROPLASTY) (Right) Active Problems:   Hip fracture (HCC)  Estimated body mass index is 20.98 kg/m as calculated from the following:   Height as of  this encounter: 5\' 6"  (1.676 m).   Weight as of this encounter: 59 kg (130 lb). Up with therapy Discharge to SNF  Labs: none DVT Prophylaxis - Lovenox, Foot Pumps and TED hose Weight-Bearing as tolerated to right leg Please change dressing prior to d/c F/u in office in 2 weeks Continue lovenox 30 mg bid for 14 at discharge Change dressing as needed  Cletis AthensJon R. [facility] PA [facility] 10/07/2015, 7:06 AM

## 2015-10-07 NOTE — Progress Notes (Signed)
Physical Therapy Treatment Patient Details Name: Derek Hendricks MRN: [id number] DOB: 04-15-1940 Today's Date: 10/07/2015    History of Present Illness Pt. is a 75 y.o. male who [facility] after falling and sustaining a right hip fx s/p hemiarthroplasty repair.    PT Comments    Pt agreeable to PT; fatigued, but complains of little pain in R hip. Pt noted to have continuous tremors in bilateral upper extremities. Pt participates well in Bilateral lower extremities exercises with assistance bilaterally. Pt with current discharge order to skilled nursing facility to continue progress on strength, endurance and all functional mobility.   Follow Up Recommendations  SNF     Equipment Recommendations       Recommendations for Other Services       Precautions / Restrictions Precautions Precautions: Fall Restrictions Weight Bearing Restrictions: Yes RLE Weight Bearing: Weight bearing as tolerated    Mobility  Bed Mobility               General bed mobility comments: Not tested  Transfers                    Ambulation/Gait                 Stairs            Wheelchair Mobility    Modified Rankin (Stroke Patients Only)       Balance                                    Cognition Arousal/Alertness: Awake/alert Behavior During Therapy: WFL for tasks assessed/performed Overall Cognitive Status: History of cognitive impairments - at baseline                      Exercises Total Joint Exercises Ankle Circles/Pumps: AAROM;20 reps;Both;Supine Quad Sets: Strengthening;Both;10 reps;Supine (increased verbal/tactile cueing) Gluteal Sets: Strengthening;10 reps;Both;Supine (increased tactile/verbal cueing) Short Arc Quad: AAROM;Both;20 reps;Supine Heel Slides: AAROM;Both;20 reps;Supine Hip ABduction/ADduction: AAROM;Both;20 reps;Supine    General Comments        Pertinent Vitals/Pain Pain Assessment: Faces Faces  Pain Scale: Hurts a little bit Pain Location: R hip Pain Descriptors / Indicators: Sore Pain Intervention(s): Limited activity within patient's tolerance;Monitored during session;Premedicated before session    Home Living                      Prior Function            PT Goals (current goals can now be found in the care plan section) Progress towards PT goals: Progressing toward goals    Frequency    BID      PT Plan Current plan remains appropriate    Co-evaluation             End of Session   Activity Tolerance: Patient limited by fatigue (weakness) Patient left: in bed;with call bell/phone within reach;with bed alarm set     Time: [id number] PT Time Calculation (min) (ACUTE ONLY): 23 min  Charges:  $Therapeutic Exercise: 23-37 mins                    G CodesScot Dock:      Jermery Caratachea E Gennie Eisinger, PTA 10/07/2015, 11:27 AM

## 2015-10-07 NOTE — Progress Notes (Signed)
Patient is medically stable for D/C to [facility] today. [facility]. Per Krystal EatonLeslie [facility] will accept a 5 day LOG because [place] authorization is still pending. Clinical Child psychotherapistocial Worker (CSW) Chiropodistassistant director approved 5 day LOG. RN will call report to 500 hall RN and arrange EMS for transport. CSW [facility]. Patient's daughter Asher MuirJamie is at bedside and aware of above. CSW explained to daughter that if [place] does not approve SNF then patient will have to discharge from [facility] after 5 days. Daughter verbalized her understanding. Please reconsult if future social work needs arise. CSW signing off.   Baker Hughes IncorporatedBailey Ilya Ess, LCSw [phone]

## 2015-10-07 NOTE — NC FL2 (Signed)
[place] MEDICAID FL2 LEVEL OF CARE SCREENING TOOL     IDENTIFICATION  Patient Name: Derek Hendricks Birthdate: 06-Sep-1940 Sex: male Admission Date (Current Location): 10/02/2015  [facility] [place]   Facility and Address:  [facility], [address], [place], [place] [address]      Provider Number: [id number]  Attending Physician Name and Address:  Enedina FinnerSona Townsel, MD  Relative Name and Phone Number:       Current Level of Care: Hospital Recommended Level of Care: Skilled Nursing Facility Prior Approval Number:    Date Approved/Denied:   PASRR Number:    Discharge Plan: SNF    Current Diagnoses: Patient Active Problem List   Diagnosis Date Noted  . Hip fracture (HCC) 10/02/2015    Orientation RESPIRATION BLADDER Height & Weight     Self, Time, Situation, Place  Normal External catheter Weight: 130 lb (59 kg) Height:  5\' 6"  (167.6 cm)  BEHAVIORAL SYMPTOMS/MOOD NEUROLOGICAL BOWEL NUTRITION STATUS   (None. )  (None. ) Continent Diet (Diet: NPO)  AMBULATORY STATUS COMMUNICATION OF NEEDS Skin   Extensive Assist Verbally Normal                       Personal Care Assistance Level of Assistance  Bathing, Feeding, Dressing Bathing Assistance: Limited assistance Feeding assistance: Independent Dressing Assistance: Limited assistance     Functional Limitations Info  Sight, Hearing, Speech Sight Info: Adequate Hearing Info: Adequate Speech Info: Impaired (Missing Teeth )    SPECIAL CARE FACTORS FREQUENCY  PT (By licensed PT), OT (By licensed OT)     PT Frequency:  (5) OT Frequency:  (5)            Contractures      Additional Factors Info  Code Status, Allergies (Full Code. )   Allergies Info:  (No Known Allergies. )           Current Medications (10/07/2015):  This is the current hospital active medication list Current Facility-Administered Medications  Medication Dose Route Frequency Provider Last  Rate Last Dose  . acetaminophen (TYLENOL) tablet 650 mg  650 mg Oral Q6H PRN Donato HeinzJames P Hooten, MD       Or  . acetaminophen (TYLENOL) suppository 650 mg  650 mg Rectal Q6H PRN Donato HeinzJames P Hooten, MD      . bisacodyl (DULCOLAX) suppository 10 mg  10 mg Rectal Daily PRN Donato HeinzJames P Hooten, MD   10 mg at 10/05/15 1341  . donepezil (ARICEPT) tablet 10 mg  10 mg Oral QHS Alexis Hugelmeyer, DO   10 mg at 10/06/15 2105  . enoxaparin (LOVENOX) injection 40 mg  40 mg Subcutaneous Q24H Donato HeinzJames P Hooten, MD   40 mg at 10/07/15 0837  . ferrous sulfate tablet 325 mg  325 mg Oral BID WC Donato HeinzJames P Hooten, MD   325 mg at 10/07/15 0837  . magnesium hydroxide (MILK OF MAGNESIA) suspension 30 mL  30 mL Oral Daily PRN Donato HeinzJames P Hooten, MD   30 mL at 10/05/15 0412  . menthol-cetylpyridinium (CEPACOL) lozenge 3 mg  1 lozenge Oral PRN Donato HeinzJames P Hooten, MD       Or  . phenol (CHLORASEPTIC) mouth spray 1 spray  1 spray Mouth/Throat PRN Donato HeinzJames P Hooten, MD      . metoCLOPramide (REGLAN) tablet 5-10 mg  5-10 mg Oral Q8H PRN Donato HeinzJames P Hooten, MD       Or  . metoCLOPramide (REGLAN) injection  5-10 mg  5-10 mg Intravenous Q8H PRN Donato Heinz, MD      . morphine 2 MG/ML injection 2 mg  2 mg Intravenous Q4H PRN Enedina Finner, MD      . ondansetron (ZOFRAN) tablet 4 mg  4 mg Oral Q6H PRN Donato Heinz, MD       Or  . ondansetron (ZOFRAN) injection 4 mg  4 mg Intravenous Q6H PRN Donato Heinz, MD      . oxyCODONE (Oxy IR/ROXICODONE) immediate release tablet 5 mg  5 mg Oral Q4H PRN Enedina Finner, MD   5 mg at 10/07/15 0502  . pantoprazole (PROTONIX) EC tablet 40 mg  40 mg Oral BID WC Donato Heinz, MD   40 mg at 10/07/15 0837  . senna-docusate (Senokot-S) tablet 1 tablet  1 tablet Oral BID Donato Heinz, MD   1 tablet at 10/07/15 0837  . sodium phosphate (FLEET) 7-19 GM/118ML enema 1 enema  1 enema Rectal Once PRN Donato Heinz, MD      . traMADol Janean Sark) tablet 50 mg  50 mg Oral Q4H PRN Enedina Finner, MD         Discharge Medications: Please  see discharge summary for a list of discharge medications.  Relevant Imaging Results:  Relevant Lab Results:   Additional Information  (SSN: [id number])  Kindsey Eblin, Darleen Crocker, LCSW

## 2015-10-07 NOTE — Progress Notes (Signed)
Called report to [facility] [place]. Answered all questions. Family at bedside and assists with communication. No complaints at this time from patient. Called EMS for transport.

## 2015-10-09 LAB — SURGICAL PATHOLOGY

## 2017-09-04 IMAGING — CR DG FEMUR 2+V*R*
4 series · 4 of 4 positions shown · non-contrast
Comparison: Right hip x-rays obtained concurrently.

CLINICAL DATA: 75-year-old who fell 2 days ago and injured the
right lower extremity. Painful ambulation since that time. Initial
encounter.

EXAM:
RIGHT FEMUR 2 VIEWS

[femur ap (1 of 2)]
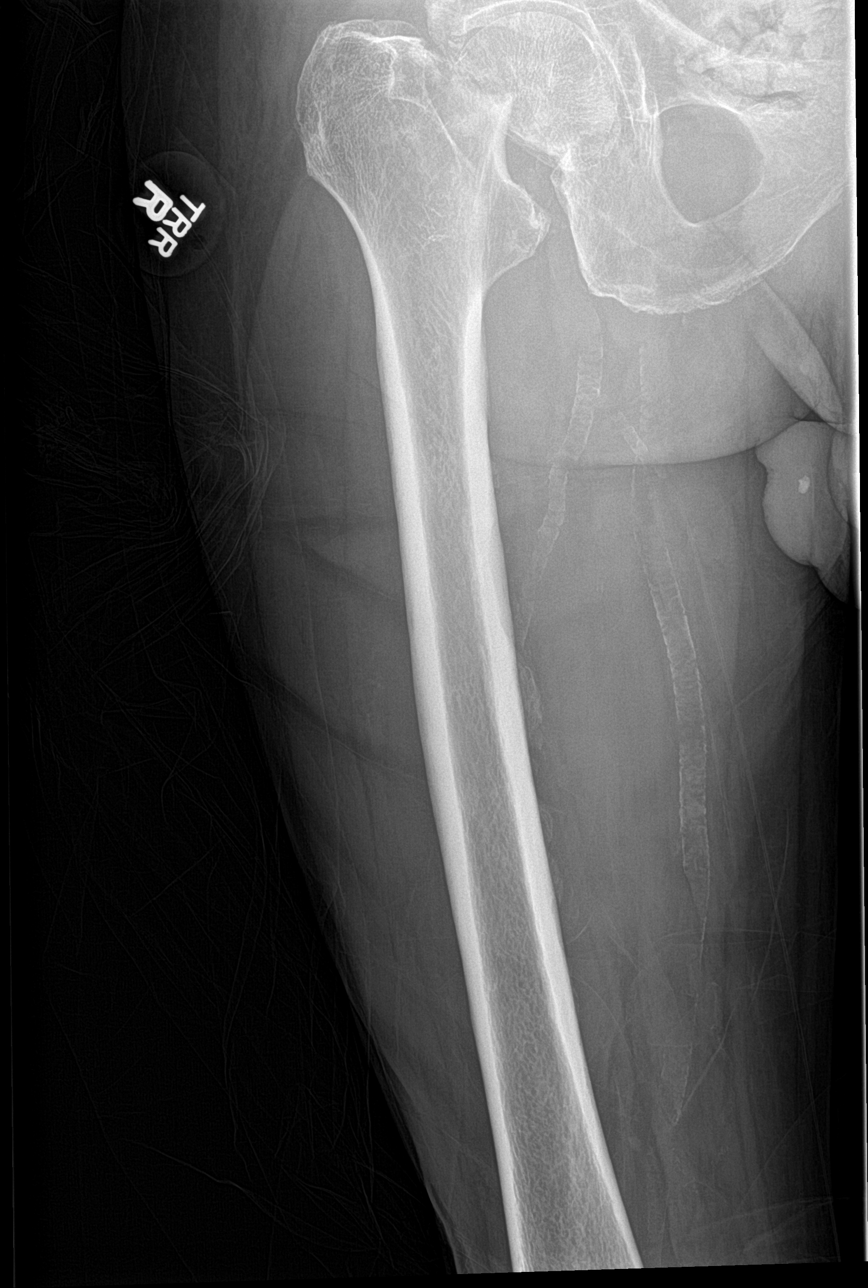

[femur ap (2 of 2)]
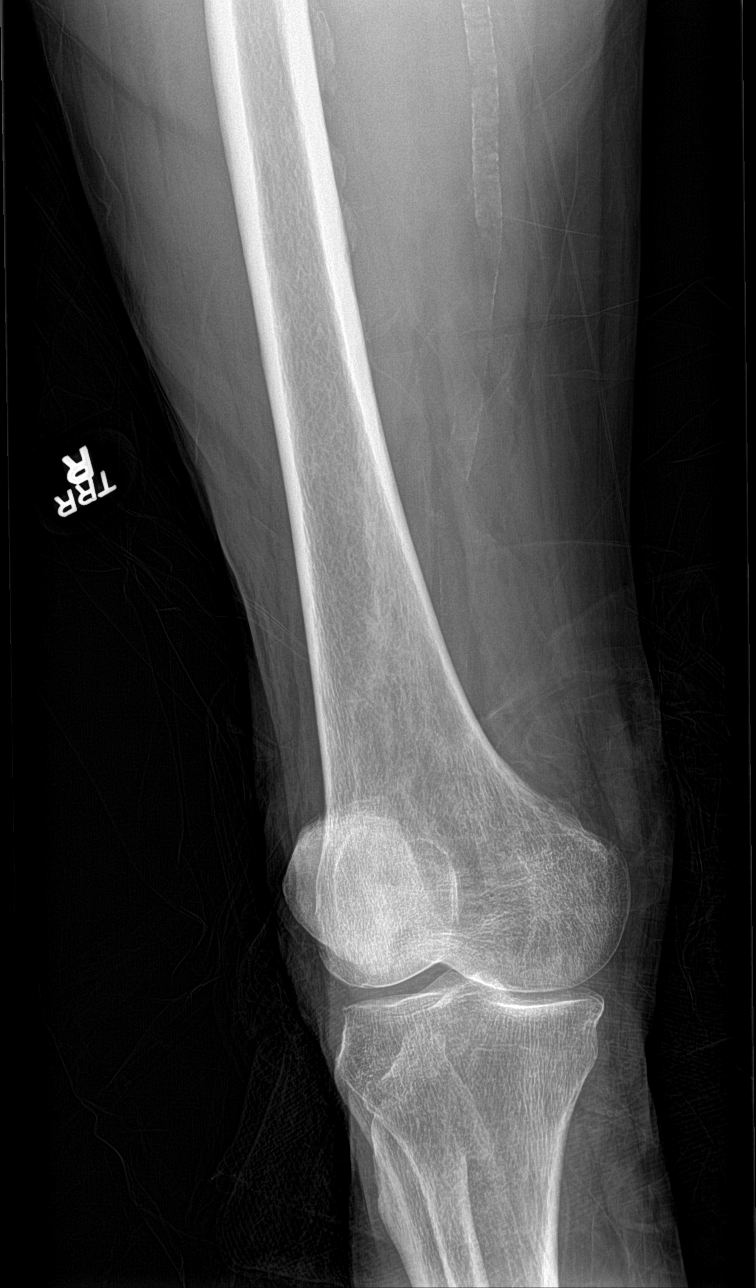

[femur lat]
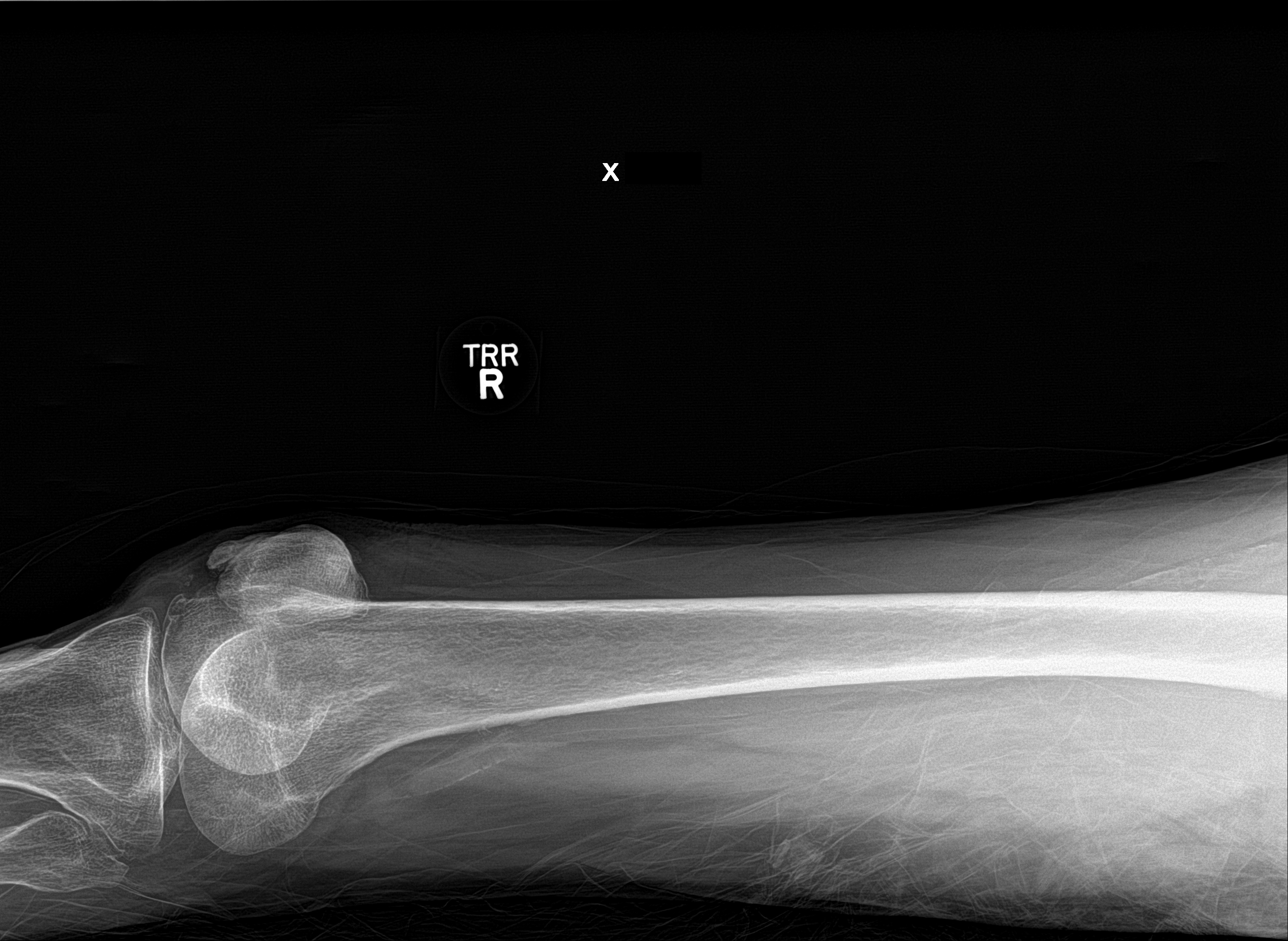

[hip lat]
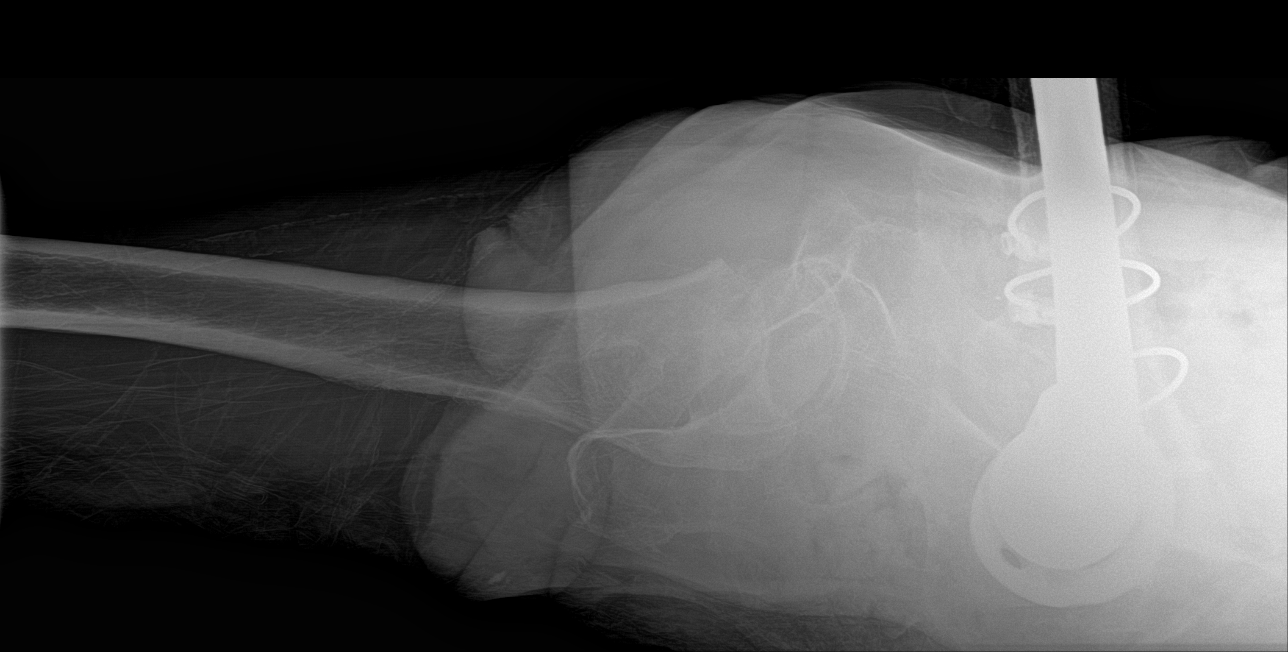

[4 of 4 positions shown; findings below may reference images not displayed]

FINDINGS: Subcapital right femoral neck fracture as detailed on the report of
the right hip images. No fractures elsewhere involving the femur.
Moderate medial compartment joint space narrowing at the knee.
IMPRESSION: Subcapital right femoral neck fracture as detailed on the report of
the right hip images. No other fractures involving the femur.

## 2017-09-06 IMAGING — DX DG HIP (WITH OR WITHOUT PELVIS) 1V PORT*R*
2 series · 2 of 2 positions shown · non-contrast
Comparison: Right hip radiographs performed 10/02/2015

CLINICAL DATA: Status post right hip arthroplasty. Initial
encounter.

EXAM:
DG HIP (WITH OR WITHOUT PELVIS) 1V PORT RIGHT

[pelvis ap]
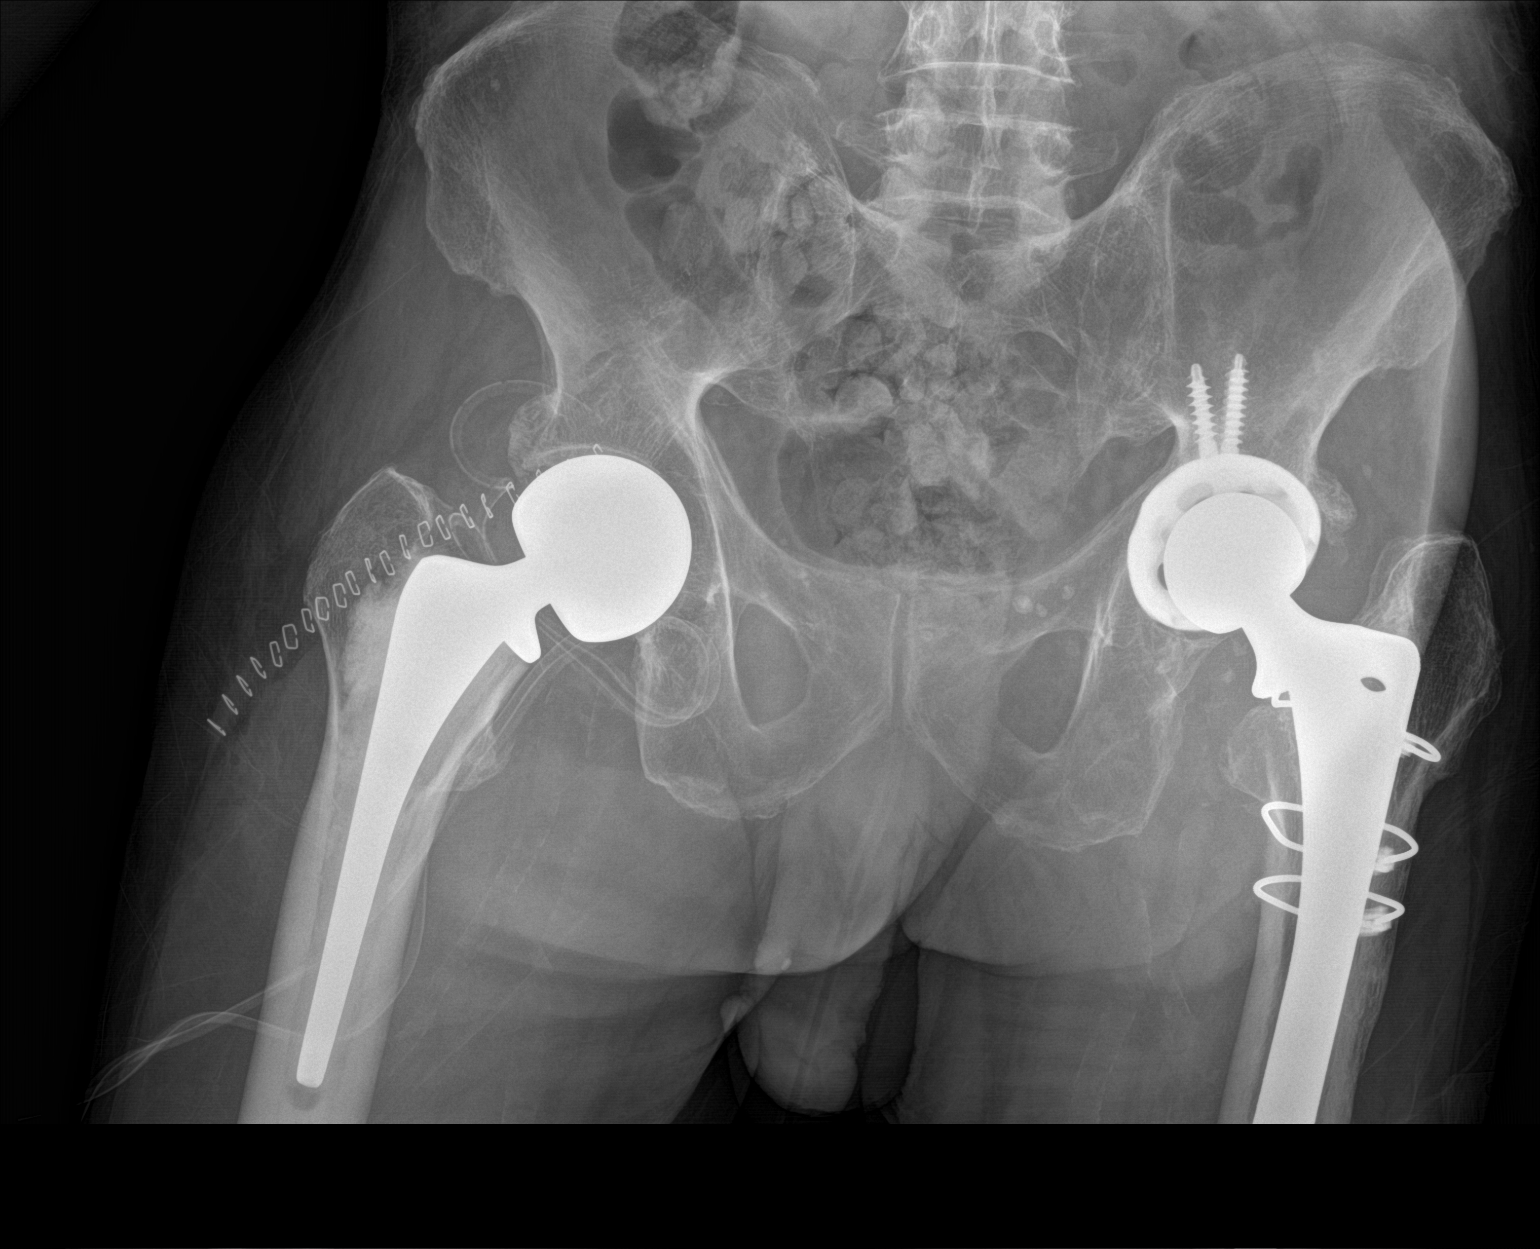

[hip lat]
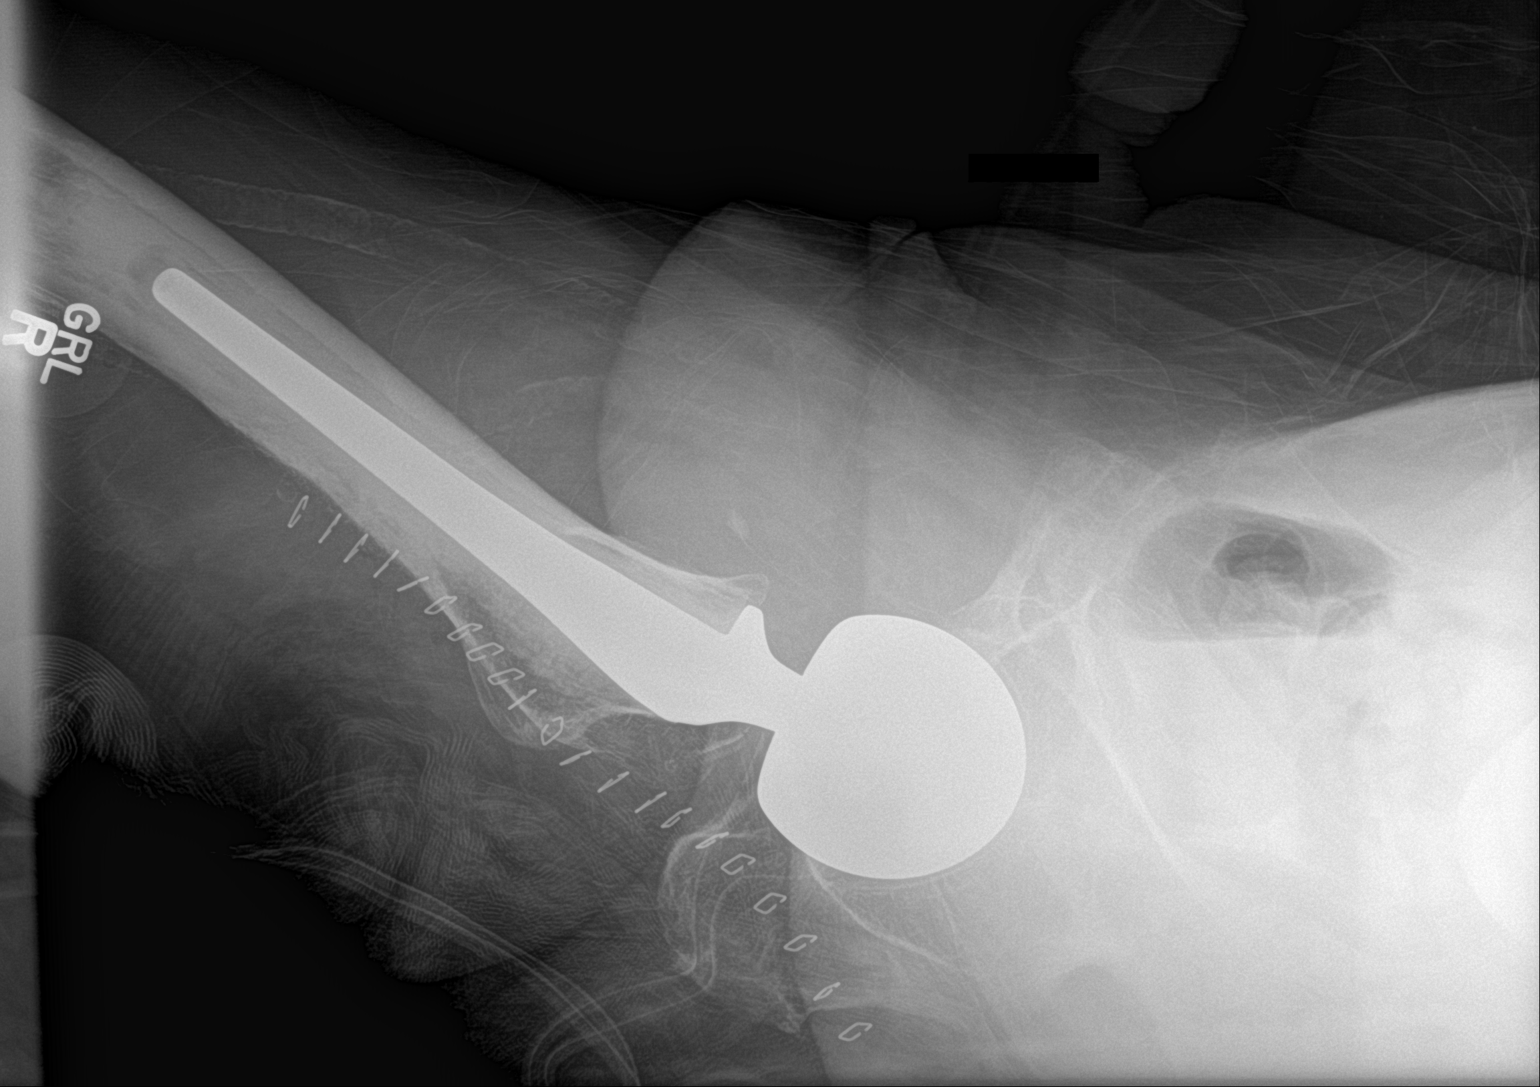

[2 of 2 positions shown; findings below may reference images not displayed]

FINDINGS: The patient is status post right hip arthroplasty. The arthroplasty
appears grossly intact, without evidence of loosening. Overlying
postoperative air and skin staples are seen, with associated drains.
The left hip arthroplasty is grossly unremarkable in appearance,
with associated cerclage wires. Diffuse vascular calcifications are
noted. The visualized bowel gas pattern is grossly unremarkable.
IMPRESSION: 1. Status post right hip arthroplasty. No evidence of fracture or
loosening.
2. Diffuse vascular calcifications seen.

## 2017-12-01 ENCOUNTER — Other Ambulatory Visit: Payer: Self-pay | Admitting: Orthopedic Surgery

## 2017-12-01 DIAGNOSIS — S32010A Wedge compression fracture of first lumbar vertebra, initial encounter for closed fracture: Secondary | ICD-10-CM

## 2017-12-03 ENCOUNTER — Ambulatory Visit: Payer: Medicare Other

## 2017-12-03 ENCOUNTER — Ambulatory Visit
Admission: RE | Admit: 2017-12-03 | Discharge: 2017-12-03 | Disposition: A | Discharge status: Home / Self Care | Payer: Medicare Other | Source: Ambulatory Visit | Attending: Orthopedic Surgery | Admitting: Orthopedic Surgery

## 2017-12-03 DIAGNOSIS — S32010A Wedge compression fracture of first lumbar vertebra, initial encounter for closed fracture: Secondary | ICD-10-CM | POA: Insufficient documentation

## 2017-12-03 DIAGNOSIS — S22080A Wedge compression fracture of T11-T12 vertebra, initial encounter for closed fracture: Secondary | ICD-10-CM | POA: Insufficient documentation

## 2017-12-07 ENCOUNTER — Ambulatory Visit: Payer: Medicare Other

## 2017-12-10 ENCOUNTER — Ambulatory Visit: Payer: Medicare Other

## 2017-12-10 ENCOUNTER — Ambulatory Visit: Payer: Medicare Other | Admitting: Anesthesiology

## 2017-12-10 ENCOUNTER — Encounter
Admission: RE | Disposition: A | Discharge status: Home / Self Care | Payer: Self-pay | Source: Home / Self Care | Attending: Orthopedic Surgery

## 2017-12-10 ENCOUNTER — Ambulatory Visit
Admission: RE | Admit: 2017-12-10 | Discharge: 2017-12-10 | Disposition: A | Discharge status: Home / Self Care | Payer: Medicare Other | Attending: Orthopedic Surgery | Admitting: Orthopedic Surgery

## 2017-12-10 ENCOUNTER — Other Ambulatory Visit: Payer: Self-pay

## 2017-12-10 DIAGNOSIS — F0280 Dementia in other diseases classified elsewhere without behavioral disturbance: Secondary | ICD-10-CM | POA: Insufficient documentation

## 2017-12-10 DIAGNOSIS — Z79899 Other long term (current) drug therapy: Secondary | ICD-10-CM | POA: Diagnosis not present

## 2017-12-10 DIAGNOSIS — W19XXXA Unspecified fall, initial encounter: Secondary | ICD-10-CM | POA: Insufficient documentation

## 2017-12-10 DIAGNOSIS — S22080A Wedge compression fracture of T11-T12 vertebra, initial encounter for closed fracture: Secondary | ICD-10-CM | POA: Insufficient documentation

## 2017-12-10 DIAGNOSIS — S22060A Wedge compression fracture of T7-T8 vertebra, initial encounter for closed fracture: Secondary | ICD-10-CM | POA: Diagnosis not present

## 2017-12-10 DIAGNOSIS — Z96643 Presence of artificial hip joint, bilateral: Secondary | ICD-10-CM | POA: Insufficient documentation

## 2017-12-10 DIAGNOSIS — Z419 Encounter for procedure for purposes other than remedying health state, unspecified: Secondary | ICD-10-CM

## 2017-12-10 DIAGNOSIS — G20 Parkinson's disease: Secondary | ICD-10-CM | POA: Insufficient documentation

## 2017-12-10 DIAGNOSIS — G309 Alzheimer's disease, unspecified: Secondary | ICD-10-CM | POA: Diagnosis not present

## 2017-12-10 HISTORY — PX: KYPHOPLASTY: SHX5884

## 2017-12-10 SURGERY — KYPHOPLASTY
Anesthesia: General

## 2017-12-10 MED ORDER — ONDANSETRON HCL 4 MG/2ML IJ SOLN: 4.0000 mg | Freq: Once | INTRAMUSCULAR | Status: DC | PRN

## 2017-12-10 MED ORDER — MIDAZOLAM HCL 2 MG/2ML IJ SOLN
INTRAMUSCULAR | Status: DC | PRN
  Administered 2017-12-10 (×2): 1 mg via INTRAVENOUS

## 2017-12-10 MED ORDER — FAMOTIDINE 20 MG PO TABS
ORAL_TABLET | ORAL | Status: AC
  Administered 2017-12-10: 20 mg via ORAL
  Filled 2017-12-10: qty 1

## 2017-12-10 MED ORDER — FENTANYL CITRATE (PF) 100 MCG/2ML IJ SOLN: 25.0000 ug | INTRAMUSCULAR | Status: DC | PRN

## 2017-12-10 MED ORDER — MIDAZOLAM HCL 2 MG/2ML IJ SOLN
INTRAMUSCULAR | Status: AC
  Filled 2017-12-10: qty 2

## 2017-12-10 MED ORDER — PHENYLEPHRINE HCL 10 MG/ML IJ SOLN
INTRAMUSCULAR | Status: DC | PRN
  Administered 2017-12-10: 100 ug via INTRAVENOUS

## 2017-12-10 MED ORDER — LIDOCAINE HCL 1 % IJ SOLN
INTRAMUSCULAR | Status: DC | PRN
  Administered 2017-12-10: 30 mL

## 2017-12-10 MED ORDER — PROPOFOL 10 MG/ML IV BOLUS
INTRAVENOUS | Status: DC | PRN
  Administered 2017-12-10: 40 mg via INTRAVENOUS

## 2017-12-10 MED ORDER — CEFAZOLIN SODIUM-DEXTROSE 1-4 GM/50ML-% IV SOLN
1.0000 g | Freq: Once | INTRAVENOUS | Status: AC
  Administered 2017-12-10: 1 g via INTRAVENOUS

## 2017-12-10 MED ORDER — CEFAZOLIN SODIUM-DEXTROSE 1-4 GM/50ML-% IV SOLN
INTRAVENOUS | Status: AC
  Filled 2017-12-10: qty 50

## 2017-12-10 MED ORDER — FAMOTIDINE 20 MG PO TABS
20.0000 mg | ORAL_TABLET | Freq: Once | ORAL | Status: AC
  Administered 2017-12-10: 20 mg via ORAL

## 2017-12-10 MED ORDER — TRAMADOL HCL 50 MG PO TABS: 50.0000 mg | ORAL_TABLET | Freq: Four times a day (QID) | ORAL | 0 refills | Status: AC | PRN

## 2017-12-10 MED ORDER — PROPOFOL 500 MG/50ML IV EMUL
INTRAVENOUS | Status: DC | PRN
  Administered 2017-12-10: 75 ug/kg/min via INTRAVENOUS

## 2017-12-10 MED ORDER — LACTATED RINGERS IV SOLN
INTRAVENOUS | Status: DC
  Administered 2017-12-10: 13:00:00 via INTRAVENOUS

## 2017-12-10 MED ORDER — BUPIVACAINE-EPINEPHRINE (PF) 0.5% -1:200000 IJ SOLN
INTRAMUSCULAR | Status: DC | PRN
  Administered 2017-12-10: 20 mL via PERINEURAL

## 2017-12-10 MED ORDER — IOPAMIDOL (ISOVUE-M 200) INJECTION 41%
INTRAMUSCULAR | Status: DC | PRN
  Administered 2017-12-10: 60 mL

## 2017-12-10 SURGICAL SUPPLY — 17 items
CEMENT KYPHON CX01A KIT/MIXER (Cement) ×2 IMPLANT
COVER WAND RF STERILE (DRAPES) ×2 IMPLANT
DERMABOND ADVANCED (GAUZE/BANDAGES/DRESSINGS) ×1
DERMABOND ADVANCED .7 DNX12 (GAUZE/BANDAGES/DRESSINGS) ×1 IMPLANT
DEVICE BIOPSY BONE KYPHX (INSTRUMENTS) ×2 IMPLANT
DRAPE C-ARM XRAY 36X54 (DRAPES) ×2 IMPLANT
DURAPREP 26ML APPLICATOR (WOUND CARE) ×2 IMPLANT
GLOVE SURG SYN 9.0  PF PI (GLOVE) ×1
GLOVE SURG SYN 9.0 PF PI (GLOVE) ×1 IMPLANT
GOWN SRG 2XL LVL 4 RGLN SLV (GOWNS) ×1 IMPLANT
GOWN STRL NON-REIN 2XL LVL4 (GOWNS) ×1
GOWN STRL REUS W/ TWL LRG LVL3 (GOWN DISPOSABLE) ×1 IMPLANT
GOWN STRL REUS W/TWL LRG LVL3 (GOWN DISPOSABLE) ×1
PACK KYPHOPLASTY (MISCELLANEOUS) ×2 IMPLANT
STRAP SAFETY 5IN WIDE (MISCELLANEOUS) ×2 IMPLANT
TRAY KYPHOPAK 15/3 EXPRESS 1ST (MISCELLANEOUS) ×2 IMPLANT
TRAY KYPHOPAK 20/3 EXPRESS 1ST (MISCELLANEOUS) ×2 IMPLANT

## 2017-12-10 NOTE — Anesthesia Post-op Follow-up Note (Signed)
Anesthesia QCDR form completed.        

## 2017-12-10 NOTE — H&P (Signed)
Reason for Visit The patient is a 77 year old male who presented with his son and daughter-in-law for evaluation of his thoracic spine. The patient is here for history and physical for an upcoming T8 and T12 kyphoplasty to be done by Dr. Rosita KeaMenz on December 10, 2017. The patient is in a wheelchair today. The patient is doing slight walking with a walker but still has a lot of pain. The patient did have a fall recently. Patient has parkinsonism. The patient does not have diabetes. Patient has a history of a right hip fracture with hemiarthroplasty.  Medications Current Outpatient Medications  Medication Sig Dispense Refill  . carbidopa-levodopa (SINEMET) 25-250 mg tablet Take 1 tablet by mouth 4 (four) times daily 120 tablet 0  . cholecalciferol, vitamin D3, 50,000 unit capsule Take 50,000 Units by mouth once daily.  . cyanocobalamin (VITAMIN B12) 1000 MCG tablet Take 1 tablet (1,000 mcg total) by mouth once daily 30 tablet 11  . donepezil (ARICEPT) 10 MG tablet Take 1 tablet (10 mg total) by mouth once daily 30 tablet 0  . miscellaneous medical supply (BLOOD PRESSURE CUFF) Misc Blood pressure cuff for neurogenic orthostatic hypotension. 1 each 0  . rOPINIRole (REQUIP) 0.5 MG tablet Take 1 tablet (0.5 mg total) by mouth 2 (two) times daily. 60 tablet 11   No current facility-administered medications for this visit.   Allergies Patient has no known allergies.  Histories Past Medical History: Past Medical History:  Diagnosis Date  . AD (Alzheimer's disease) (CMS-HCC)  . Alzheimer disease (CMS-HCC)  . Dementia (CMS-HCC)  . Parkinson disease (CMS-HCC)   Past Surgical History: Past Surgical History:  Procedure Laterality Date  . JOINT REPLACEMENT Right 10/03/2015  HIP  . JOINT REPLACEMENT Left  HIP  . REPLACEMENT TOTAL HIP W/ RESURFACING IMPLANTS Left   Social History: Social History   Socioeconomic History  . Marital status: Married  Spouse name: Not on file  . Number of children:  Not on file  . Years of education: Not on file  . Highest education level: Not on file  Occupational History  . Not on file  Social Needs  . Financial resource strain: Not on file  . Food insecurity:  Worry: Not on file  Inability: Not on file  . Transportation needs:  Medical: Not on file  Non-medical: Not on file  Tobacco Use  . Smoking status: Never Smoker  . Smokeless tobacco: Former Engineer, waterUser  Substance and Sexual Activity  . Alcohol use: No  . Drug use: Never  . Sexual activity: Defer  Partners: Female  Lifestyle  . Physical activity:  Days per week: Not on file  Minutes per session: Not on file  . Stress: Not on file  Relationships  . Social connections:  Talks on phone: Not on file  Gets together: Not on file  Attends religious service: Not on file  Active member of club or organization: Not on file  Attends meetings of clubs or organizations: Not on file  Relationship status: Not on file  Other Topics Concern  . Not on file  Social History Narrative  ** Merged History Encounter **    Family History: Family History  Problem Relation Age of Onset  . No Known Problems Mother  . No Known Problems Father   Review of Systems A comprehensive 14 point ROS was performed, reviewed, and the pertinent orthopaedic findings are documented in the HPI.  Exam BP 109/57  Pulse 75  Ht 172.7 cm (5\' 8" )  Wt 54.4 kg (  120 lb)  BMI 18.25 kg/m   General: Well developed, well nourished 77 y.o. male in no apparent distress. Flat affect. Limited communication. The patient is in a wheelchair with no gait noted.  Heart: Examination of the heart reveals regular, rate, and rhythm. There is no murmur noted on ascultation. There is a normal apical pulse.  Lungs: Lungs are clear to auscultation. There is no wheeze, rhonchi, or crackles. There is normal expansion of bilateral chest walls.   Thoracic and lumbar spine: Evaluation of the thoracic and lumbar spine reveals mild step-off  with increased kyphosis at the lower thoracic and upper lumbar region. He is tender at the T12-L1 level to palpation. Limited flexion and rotation was tested. The patient has no specific iliac crest or pelvic tenderness. The patient has slight limitation on range of motion of both hip joints with flexion and rotation. He has no real tenderness along the greater trochanter region on the right. The patient has a negative straight leg raise bilaterally.  Xrays: Xrays were reviewed of the thoracic spine that were taken at another clinic on November 26, 2017. The xrays showed an L1 and T12 vertebral compression fracture that may be old versus new with wedge deformity. There is significant osteopenia on x-ray.  Xrays: Xrays were reviewed of the lumbar spine and pelvis that were taken at [facility] on November 10, 2017. The xrays showed the previous right hip hemiarthroplasty with a greater trochanteric displaced fracture that may appear to be old. There is a left total hip replacement that appears to be in good position with no loosening.  MRI Results: MRI of the thoracic spine was completed on December 03, 2017. The MRI revealed a T12 vertebral compression fracture that appears to be severely compressed and acute. There is also a T8 vertebral compression fracture with very early healing..   Impression Encounter Diagnoses  Name Primary?  . Closed wedge compression fracture of eighth thoracic vertebra with delayed healing, subsequent encounter Yes  . Closed wedge compression fracture of twelfth thoracic vertebra with delayed healing, subsequent encounter   Plan  1. I discussed the physical exam finding and MRI results with the patient as well as upcoming surgery. 2. The patient is being set up for a T8 and T12 vertebral compression fracture kyphoplasty to be done by Dr. Rosita Kea on December 10, 2017. I reviewed the risk, benefits, and alternatives to the surgery. The patient seems understand and wants  to set up for the stay, including the family agrees. 3. The patient will follow-up after surgery for postoperative care.    Reviewed paper H+P, will be scanned into chart. No changes noted.

## 2017-12-10 NOTE — Transfer of Care (Signed)
Immediate Anesthesia Transfer of Care Note  Patient: Derek Hendricks  Procedure(s) Performed: Derek CarrowKYPHOPLASTY-T12,  T8 (N/A )  Patient Location: PACU  Anesthesia Type:General  Level of Consciousness: sedated  Airway & Oxygen Therapy: Patient Spontanous Breathing and Patient connected to nasal cannula oxygen  Post-op Assessment: Report given to RN and Post -op Vital signs reviewed and stable  Post vital signs: Reviewed and stable  Last Vitals:  Vitals Value Taken Time  BP    Temp    Pulse 87 12/10/2017  4:25 PM  Resp    SpO2 100 % 12/10/2017  4:25 PM  Vitals shown include unvalidated device data.  Last Pain:  Vitals:   12/10/17 1249  TempSrc: Temporal         Complications: No apparent anesthesia complications

## 2017-12-10 NOTE — Op Note (Signed)
Date  12/10/17  Time 4:23 pm   PATIENT: Derek Hendricks   PRE-OPERATIVE DIAGNOSIS:  closed wedge compression fracture of T12 and T8   POST-OPERATIVE DIAGNOSIS:  closed wedge compression fracture of T12 and T8   PROCEDURE:  Procedure(s): KYPHOPLASTY T12 and T8  SURGEON: Laurene Footman, MD   ASSISTANTS: None   ANESTHESIA:   local and MAC   EBL:  No intake/output data recorded.   BLOOD ADMINISTERED:none   DRAINS: none    LOCAL MEDICATIONS USED:  MARCAINE    and XYLOCAINE    SPECIMEN:   T12 and T8 vertebral body biopsies   DISPOSITION OF SPECIMEN:  Pathology   COUNTS:  YES   TOURNIQUET:  * No tourniquets in log *   IMPLANTS: Bone cement   DICTATION: .Dragon Dictation  patient was brought to the operating room and after adequate anesthesia was obtained the patient was placed prone.  C arm was brought in in good visualization of the affected level obtained on both AP and lateral projections.  After patient identification and timeout procedures were completed, local anesthetic was infiltrated with 10 cc 1% Xylocaine infiltrated subcutaneously.  This is done the area on the right side T8 and both sides at T12 of the planned approach.  The back was then prepped and draped in the usual sterile manner and repeat timeout procedure carried out.  A spinal needle was brought down to the pedicle on the right side of  T8 and both sides at T12 and a 50-50 mix of 1% Xylocaine half percent Sensorcaine with epinephrine total of 20 cc injected.  After allowing this to set a small incision was made and the trocar was advanced into the vertebral body in an extrapedicular fashion.  Biopsy was obtained to both levels. Drilling was carried out balloon inserted with inflation  at T8 to cc, at T12 coming across in the right side the balloon could be inflated to 3 and half cc and gave very good correction of a vertebra plana.  .  When the cement was appropriate consistency 2-1/2 cc to T8, 3-1/2 inches T12 were  injected into the vertebral body without extravasation, good fill superior to inferior endplates and from right to left sides along the inferior endplate.  After the cement had set the trochar was removed and permanent C-arm views obtained.  The wound was closed with Dermabond followed by Band-Aid   PLAN OF CARE: Discharge to home after PACU   PATIENT DISPOSITION:  PACU - hemodynamically stable.

## 2017-12-10 NOTE — Anesthesia Preprocedure Evaluation (Signed)
Anesthesia Evaluation  Patient identified by MRN, date of birth, ID band Patient awake    Reviewed: Allergy & Precautions, NPO status , Patient's Chart, lab work & pertinent test results  History of Anesthesia Complications Negative for: history of anesthetic complications  Airway Mallampati: II  TM Distance: >3 FB Neck ROM: Full    Dental  (+) Poor Dentition   Pulmonary neg pulmonary ROS, neg sleep apnea, neg COPD,    breath sounds clear to auscultation- rhonchi (-) wheezing      Cardiovascular (-) hypertension(-) CAD, (-) Past MI, (-) Cardiac Stents and (-) CABG  Rhythm:Regular Rate:Normal - Systolic murmurs and - Diastolic murmurs    Neuro/Psych neg Seizures PSYCHIATRIC DISORDERS Dementia Parkinson's disease     GI/Hepatic negative GI ROS, Neg liver ROS,   Endo/Other  negative endocrine ROSneg diabetes  Renal/GU negative Renal ROS     Musculoskeletal negative musculoskeletal ROS (+)   Abdominal (+) - obese,   Peds  Hematology negative hematology ROS (+)   Anesthesia Other Findings Past Medical History: No date: Alzheimer disease (HCC) No date: Parkinson disease (HCC)   Reproductive/Obstetrics                             Anesthesia Physical Anesthesia Plan  ASA: II  Anesthesia Plan: General   Post-op Pain Management:    Induction: Intravenous  PONV Risk Score and Plan: 1 and Propofol infusion  Airway Management Planned: Natural Airway  Additional Equipment:   Intra-op Plan:   Post-operative Plan:   Informed Consent: I have reviewed the patients History and Physical, chart, labs and discussed the procedure including the risks, benefits and alternatives for the proposed anesthesia with the patient or authorized representative who has indicated his/her understanding and acceptance.   Dental advisory given  Plan Discussed with: CRNA and Anesthesiologist  Anesthesia Plan  Comments:         Anesthesia Quick Evaluation

## 2017-12-10 NOTE — Discharge Instructions (Addendum)
Take it easy today and tomorrow and try to resume more normal activities on Sunday.  Remove Band-Aids on Sunday then okay to shower.  Pain medicine as directed.  AMBULATORY SURGERY  DISCHARGE INSTRUCTIONS   1) The drugs that you were given will stay in your system until tomorrow so for the next 24 hours you should not:  A) Drive an automobile B) Make any legal decisions C) Drink any alcoholic beverage   2) You may resume regular meals tomorrow.  Today it is better to start with liquids and gradually work up to solid foods.  You may eat anything you prefer, but it is better to start with liquids, then soup and crackers, and gradually work up to solid foods.   3) Please notify your doctor immediately if you have any unusual bleeding, trouble breathing, redness and pain at the surgery site, drainage, fever, or pain not relieved by medication.    4) Additional Instructions: TAKE A STOOL SOFTENER TWICE A DAY WHILE TAKING NARCOTIC PAIN MEDICINE TO PREVENT CONSTIPATION   Please contact your physician with any problems or Same Day Surgery at [phone], Monday through Friday 6 am to 4 pm, or [place] at [facility] Main number at [phone].

## 2017-12-10 NOTE — Progress Notes (Signed)
Discharge instructions given to pts daughter , pt discharged to home with daughter , son and son in law .VSS , denies pain.

## 2017-12-10 NOTE — Progress Notes (Signed)
Consent form signed by daughter and Rosine BeatHCPOA Jaimini Grabel d/t dementia, hand tremors.  No recent EKG, ok per anesthesiologist.

## 2017-12-11 NOTE — Anesthesia Postprocedure Evaluation (Signed)
Anesthesia Post Note  Patient: Derek Hendricks  Procedure(s) Performed: Dorcas CarrowKYPHOPLASTY-T12,  T8 (N/A )  Patient location during evaluation: PACU Anesthesia Type: General Level of consciousness: awake and alert and oriented Pain management: pain level controlled Vital Signs Assessment: post-procedure vital signs reviewed and stable Respiratory status: spontaneous breathing Cardiovascular status: blood pressure returned to baseline Anesthetic complications: no     Last Vitals:  Vitals:   12/10/17 1713 12/10/17 1728  BP: (!) 141/76 (!) 148/98  Pulse: 88 83  Resp: 16 18  Temp: 36.9 C (!) 36.4 C  SpO2: 99% 100%    Last Pain:  Vitals:   12/10/17 1728  TempSrc: Temporal                 [name]

## 2017-12-13 ENCOUNTER — Encounter: Payer: Self-pay | Admitting: Orthopedic Surgery

## 2017-12-16 LAB — SURGICAL PATHOLOGY

## 2018-01-21 ENCOUNTER — Other Ambulatory Visit (HOSPITAL_COMMUNITY): Payer: Self-pay | Admitting: Internal Medicine

## 2018-01-21 ENCOUNTER — Other Ambulatory Visit: Payer: Self-pay | Admitting: Internal Medicine

## 2018-01-21 DIAGNOSIS — R270 Ataxia, unspecified: Secondary | ICD-10-CM

## 2018-01-21 DIAGNOSIS — R296 Repeated falls: Secondary | ICD-10-CM

## 2018-02-02 ENCOUNTER — Ambulatory Visit: Payer: Medicare Other

## 2018-03-25 ENCOUNTER — Other Ambulatory Visit: Payer: Self-pay | Admitting: Internal Medicine

## 2018-03-25 ENCOUNTER — Other Ambulatory Visit: Payer: Self-pay

## 2018-03-25 ENCOUNTER — Ambulatory Visit
Admission: RE | Admit: 2018-03-25 | Discharge: 2018-03-25 | Disposition: A | Discharge status: Home / Self Care | Payer: Medicare Other | Source: Ambulatory Visit | Attending: Internal Medicine | Admitting: Internal Medicine

## 2018-03-25 DIAGNOSIS — M7989 Other specified soft tissue disorders: Secondary | ICD-10-CM | POA: Insufficient documentation

## 2018-04-07 ENCOUNTER — Emergency Department
Admission: EM | Admit: 2018-04-07 | Discharge: 2018-04-07 | Disposition: A | Discharge status: Home / Self Care | Payer: Medicare Other | Attending: Emergency Medicine | Admitting: Emergency Medicine

## 2018-04-07 ENCOUNTER — Emergency Department: Payer: Medicare Other

## 2018-04-07 ENCOUNTER — Other Ambulatory Visit: Payer: Self-pay

## 2018-04-07 ENCOUNTER — Encounter: Payer: Self-pay | Admitting: Emergency Medicine

## 2018-04-07 DIAGNOSIS — S0512XA Contusion of eyeball and orbital tissues, left eye, initial encounter: Secondary | ICD-10-CM | POA: Diagnosis not present

## 2018-04-07 DIAGNOSIS — G20 Parkinson's disease: Secondary | ICD-10-CM | POA: Insufficient documentation

## 2018-04-07 DIAGNOSIS — Z23 Encounter for immunization: Secondary | ICD-10-CM | POA: Diagnosis not present

## 2018-04-07 DIAGNOSIS — Z96643 Presence of artificial hip joint, bilateral: Secondary | ICD-10-CM | POA: Insufficient documentation

## 2018-04-07 DIAGNOSIS — S0990XA Unspecified injury of head, initial encounter: Secondary | ICD-10-CM

## 2018-04-07 DIAGNOSIS — Y9384 Activity, sleeping: Secondary | ICD-10-CM | POA: Insufficient documentation

## 2018-04-07 DIAGNOSIS — Y92003 Bedroom of unspecified non-institutional (private) residence as the place of occurrence of the external cause: Secondary | ICD-10-CM | POA: Insufficient documentation

## 2018-04-07 DIAGNOSIS — Y999 Unspecified external cause status: Secondary | ICD-10-CM | POA: Insufficient documentation

## 2018-04-07 DIAGNOSIS — Z79899 Other long term (current) drug therapy: Secondary | ICD-10-CM | POA: Diagnosis not present

## 2018-04-07 DIAGNOSIS — S01112A Laceration without foreign body of left eyelid and periocular area, initial encounter: Secondary | ICD-10-CM | POA: Diagnosis not present

## 2018-04-07 DIAGNOSIS — W06XXXA Fall from bed, initial encounter: Secondary | ICD-10-CM | POA: Insufficient documentation

## 2018-04-07 DIAGNOSIS — G309 Alzheimer's disease, unspecified: Secondary | ICD-10-CM | POA: Insufficient documentation

## 2018-04-07 DIAGNOSIS — S0993XA Unspecified injury of face, initial encounter: Secondary | ICD-10-CM | POA: Diagnosis present

## 2018-04-07 MED ORDER — TETANUS-DIPHTH-ACELL PERTUSSIS 5-2.5-18.5 LF-MCG/0.5 IM SUSP
0.5000 mL | Freq: Once | INTRAMUSCULAR | Status: AC
  Administered 2018-04-07: 20:00:00 0.5 mL via INTRAMUSCULAR
  Filled 2018-04-07: qty 0.5

## 2018-04-07 NOTE — ED Notes (Signed)
Family here to pick up pt. Instructions given

## 2018-04-07 NOTE — ED Notes (Signed)
Patient transported to CT 

## 2018-04-07 NOTE — ED Provider Notes (Signed)
[facility] Emergency Department Provider Note  ____________________________________________   First MD Initiated Contact with Patient 04/07/18 1813     (approximate)  I have reviewed the triage vital signs and the nursing notes.   HISTORY  Chief Complaint Fall   HPI Derek Hendricks is a 78 y.o. male who presents to the emergency department via EMS after falling approximately 18" from his bed. He is nonambulatory at baseline with a history of Alzheimers and Parkinson's. He states that he "rolled out of the bed." He struck his head and now has a laceration above the left eye. No reported loss of consciousness.    Past Medical History:  Diagnosis Date  . Alzheimer disease (HCC)   . Parkinson disease [facility])     Patient Active Problem List   Diagnosis Date Noted  . Hip fracture (HCC) 10/02/2015    Past Surgical History:  Procedure Laterality Date  . HIP ARTHROPLASTY Right 10/03/2015   Procedure: ARTHROPLASTY BIPOLAR HIP (HEMIARTHROPLASTY);  Surgeon: Donato Heinz, MD;  Location: [facility] ORS;  Service: Orthopedics;  Laterality: Right;  . HIP SURGERY     x2; now with left total hip arthroplasty with cerclage cables  . KYPHOPLASTY N/A 12/10/2017   Procedure: Veatrice Kells;  Surgeon: Kennedy Bucker, MD;  Location: [facility] ORS;  Service: Orthopedics;  Laterality: N/A;    Prior to Admission medications   Medication Sig Start Date End Date Taking? Authorizing Provider  ciprofloxacin (CIPRO) 500 MG tablet Take 500 mg by mouth 2 (two) times daily. 04/04/18 04/11/18 Yes [provider]  vitamin B-12 (CYANOCOBALAMIN) 1000 MCG tablet Take 1,000 mcg by mouth daily. 09/21/17 09/21/18 Yes [provider]  bisacodyl (DULCOLAX) 10 MG suppository Place 1 suppository (10 mg total) rectally daily as needed for moderate constipation. 10/07/15   Enedina Finner, MD  carbidopa-levodopa (SINEMET IR) 25-250 MG tablet Take 1 tablet by mouth 3 (three) times daily.     [provider]  Cholecalciferol (D3-50) 1.25 MG (50000 UT) capsule Take 50,000 Units by mouth once a week.    [provider]  donepezil (ARICEPT) 10 MG tablet Take 10 mg by mouth at bedtime.    [provider]  enoxaparin (LOVENOX) 40 MG/0.4ML injection Inject 0.4 mLs (40 mg total) into the skin daily. Patient not taking: Reported on 12/10/2017 10/07/15   Enedina Finner, MD  ferrous sulfate 325 (65 FE) MG tablet Take 1 tablet (325 mg total) by mouth 2 (two) times daily with a meal. 10/07/15   Enedina Finner, MD  rOPINIRole (REQUIP XL) 2 MG 24 hr tablet Take 2 mg by mouth at bedtime. 12/28/17   [provider]  senna-docusate (SENOKOT-S) 8.6-50 MG tablet Take 1 tablet by mouth 2 (two) times daily. 10/07/15   Enedina Finner, MD  traMADol (ULTRAM) 50 MG tablet Take 1 tablet (50 mg total) by mouth every 6 (six) hours as needed. 12/10/17 12/10/18  Kennedy Bucker, MD    Allergies Patient has no known allergies.  No family history on file.  Social History Social History   Tobacco Use  . Smoking status: Never Smoker  . Smokeless tobacco: Never Used  Substance Use Topics  . Alcohol use: No  . Drug use: No    Review of Systems  Constitutional: No fever/chills Eyes: No visual changes. ENT: No sore throat. Cardiovascular: Denies chest pain. Respiratory: Denies shortness of breath. Gastrointestinal: No abdominal pain.  No nausea, no vomiting.  No diarrhea. Genitourinary: Negative for dysuria. Musculoskeletal: Negative  for back pain. Skin: Positive for laceration over the left eyebrow. Neurological: Positive for headaches. ____________________________________________   PHYSICAL EXAM:  VITAL SIGNS: ED Triage Vitals [04/07/18 1811]  Enc Vitals Group     BP (!) 195/91     Pulse Rate 63     Resp 12     Temp 98 F (36.7 C)     Temp Source Axillary     SpO2 100 %     Weight      Height      Head Circumference      Peak Flow      Pain Score      Pain Loc       Pain Edu?      Excl. in GC?     Constitutional: Alert and oriented. Chronically ill appearing and in no acute distress. Eyes: Conjunctivae are normal. PERRL. EOMI and without pain. Ecchymosis in the left upper eyelid. Head: 2.5cm laceration over the left eye without active bleeding.  Nose: No congestion/rhinnorhea. Ears: No hemotympanum. Mouth/Throat: Mucous membranes are moist. No injury to tongue or teeth. No malocclusion. No complaint with firm palpation over mandible, maxilla, or orbits.  Oropharynx non-erythematous. Neck: No stridor.   Cardiovascular: Bradycardic, regular rhythm. Grossly normal heart sounds.  Good peripheral circulation. Respiratory: Normal respiratory effort.  No retractions. Lungs CTAB. Gastrointestinal: Soft and nontender. No distention. No abdominal bruits. Musculoskeletal: No lower extremity tenderness nor edema.  No joint effusions. Neurologic:  Normal speech and language. No gross focal neurologic deficits are appreciated. No gait instability. Skin:  Skin is warm, dry and intact. No rash noted. Psychiatric: Mood and affect are normal. Speech and behavior are normal.  ____________________________________________   LABS (all labs ordered are listed, but only abnormal results are displayed)  Labs Reviewed - No data to display ____________________________________________  EKG  Not indicated. ____________________________________________  RADIOLOGY  ED MD interpretation:  CT head and cervical spine are reassuring.  Official radiology report(s): Ct Head Wo Contrast  Result Date: 04/07/2018 CLINICAL DATA:  Seven 19-year-old male with maxillofacial trauma. EXAM: CT HEAD WITHOUT CONTRAST CT CERVICAL SPINE WITHOUT CONTRAST TECHNIQUE: Multidetector CT imaging of the head and cervical spine was performed following the standard protocol without intravenous contrast. Multiplanar CT image reconstructions of the cervical spine were also generated. COMPARISON:   None. FINDINGS: CT HEAD FINDINGS Brain: There is moderate age-related atrophy and chronic microvascular ischemic changes. There is no acute intracranial hemorrhage. No mass effect or midline shift. Vascular: No hyperdense vessel or unexpected calcification. Skull: Normal. Negative for fracture or focal lesion. Sinuses/Orbits: Mild mucoperiosteal thickening paranasal sinuses. No air-fluid level. The mastoid air cells are clear. Other: Left forehead contusion. CT CERVICAL SPINE FINDINGS Alignment: No acute subluxation. Skull base and vertebrae: No acute fracture. Osteopenia. Soft tissues and spinal canal: No prevertebral fluid or swelling. No visible canal hematoma. Disc levels: Multilevel degenerative changes primarily at C5-C6 and C6-C7. Upper chest: There is a 4 mm right upper lobe nodule. This can be further evaluated by chest CT on a nonemergent basis. Other: Mild bilateral carotid bulb calcified plaques. IMPRESSION: 1. No acute intracranial hemorrhage. Moderate age-related atrophy and chronic microvascular ischemic changes. 2. No acute/traumatic cervical spine pathology. Multilevel degenerative changes. 3. A 4 mm right upper lobe pulmonary nodule. Electronically Signed   By: Elgie Collard M.D.   On: 04/07/2018 19:28   Ct Cervical Spine Wo Contrast  Result Date: 04/07/2018 CLINICAL DATA:  Seven 57-year-old male with maxillofacial trauma. EXAM: CT HEAD WITHOUT CONTRAST  CT CERVICAL SPINE WITHOUT CONTRAST TECHNIQUE: Multidetector CT imaging of the head and cervical spine was performed following the standard protocol without intravenous contrast. Multiplanar CT image reconstructions of the cervical spine were also generated. COMPARISON:  None. FINDINGS: CT HEAD FINDINGS Brain: There is moderate age-related atrophy and chronic microvascular ischemic changes. There is no acute intracranial hemorrhage. No mass effect or midline shift. Vascular: No hyperdense vessel or unexpected calcification. Skull: Normal.  Negative for fracture or focal lesion. Sinuses/Orbits: Mild mucoperiosteal thickening paranasal sinuses. No air-fluid level. The mastoid air cells are clear. Other: Left forehead contusion. CT CERVICAL SPINE FINDINGS Alignment: No acute subluxation. Skull base and vertebrae: No acute fracture. Osteopenia. Soft tissues and spinal canal: No prevertebral fluid or swelling. No visible canal hematoma. Disc levels: Multilevel degenerative changes primarily at C5-C6 and C6-C7. Upper chest: There is a 4 mm right upper lobe nodule. This can be further evaluated by chest CT on a nonemergent basis. Other: Mild bilateral carotid bulb calcified plaques. IMPRESSION: 1. No acute intracranial hemorrhage. Moderate age-related atrophy and chronic microvascular ischemic changes. 2. No acute/traumatic cervical spine pathology. Multilevel degenerative changes. 3. A 4 mm right upper lobe pulmonary nodule. Electronically Signed   By: Elgie Collard M.D.   On: 04/07/2018 19:28    ____________________________________________   PROCEDURES  Procedure(s) performed: Wound to left eyebrow cleaned with hibiclens and saline then dermabond applied. Wound was well approximated. No active bleeding.  Procedures  Critical Care performed: No  ____________________________________________   INITIAL IMPRESSION / ASSESSMENT AND PLAN / ED COURSE    78 year old male presents to the emergency department for treatment and evaluation after non-syncopal fall from low level. Wound above the left  Eye was cleaned well and repaired as above. CT of the head and cervical spine is reassuring.   No labs drawn today. Daughter states that he recently had an evaluation by Dr. Marcello Fennel that included labs. She reports that she sent him here mainly due to the laceration above the eye. I advised her of wound care and that his Tdap  Booster was given today. She was encouraged to return with him to the ER for symptoms of concern if unable to see primary  care.      ____________________________________________   FINAL CLINICAL IMPRESSION(S) / ED DIAGNOSES  Final diagnoses:  Minor head injury, initial encounter  Laceration of left eyebrow, initial encounter  Eye contusion, left, initial encounter     ED Discharge Orders    None       Note:  This document was prepared using Dragon voice recognition software and may include unintentional dictation errors.    Chinita Pester, FNP 04/07/18 8119    Phineas Semen, MD 04/07/18 2150

## 2018-04-07 NOTE — Discharge Instructions (Signed)
Keep the wound clean and dry.  Follow up with Dr Marcello Fennel for concerns. If unable to schedule an appointment, return to the ER.

## 2018-04-07 NOTE — ED Notes (Signed)
Pt speaks [place], dialect of [place] which is not included in [employer].  Triage completed per help of patients daughter, Kalvyn Seckinger via telephone.

## 2018-04-07 NOTE — ED Triage Notes (Signed)
Pt in via POV per EMS.  Per EMS, pt rolled out of bed approximately 18" from the floor.  Laceration and contusion noted to left forehead upon arrival.  Family reports multiple falls due to Parkinsons.  NAD noted at this time.

## 2018-04-07 NOTE — ED Notes (Signed)
Pt unable to sign  

## 2018-06-06 DEATH — deceased
